# Patient Record
Sex: Female | Born: 1955
Health system: Southern US, Community
[De-identification: ages and names within clinical notes are randomized; demographics above are authoritative.]

## PROBLEM LIST (undated history)

## (undated) DIAGNOSIS — F32A Depression, unspecified: Secondary | ICD-10-CM

## (undated) DIAGNOSIS — Z87442 Personal history of urinary calculi: Secondary | ICD-10-CM

## (undated) DIAGNOSIS — F329 Major depressive disorder, single episode, unspecified: Secondary | ICD-10-CM

---
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
## 2014-06-13 ENCOUNTER — Other Ambulatory Visit (HOSPITAL_COMMUNITY)
Admission: RE | Admit: 2014-06-13 | Discharge: 2014-06-13 | Disposition: A | Discharge status: Home / Self Care | Payer: 59 | Source: Ambulatory Visit | Attending: Family Medicine | Admitting: Family Medicine

## 2014-06-13 DIAGNOSIS — Z124 Encounter for screening for malignant neoplasm of cervix: Secondary | ICD-10-CM | POA: Insufficient documentation

## 2014-06-18 ENCOUNTER — Other Ambulatory Visit: Payer: Self-pay | Admitting: Family Medicine

## 2014-06-18 DIAGNOSIS — D242 Benign neoplasm of left breast: Secondary | ICD-10-CM

## 2014-06-18 DIAGNOSIS — R928 Other abnormal and inconclusive findings on diagnostic imaging of breast: Secondary | ICD-10-CM

## 2014-07-02 ENCOUNTER — Ambulatory Visit
Admission: RE | Admit: 2014-07-02 | Discharge: 2014-07-02 | Disposition: A | Discharge status: Home / Self Care | Payer: 59 | Source: Ambulatory Visit | Attending: Family Medicine | Admitting: Family Medicine

## 2014-07-02 DIAGNOSIS — D242 Benign neoplasm of left breast: Secondary | ICD-10-CM

## 2014-07-02 DIAGNOSIS — R928 Other abnormal and inconclusive findings on diagnostic imaging of breast: Secondary | ICD-10-CM

## 2015-09-23 ENCOUNTER — Other Ambulatory Visit: Payer: Self-pay | Admitting: Family Medicine

## 2015-09-23 DIAGNOSIS — R928 Other abnormal and inconclusive findings on diagnostic imaging of breast: Secondary | ICD-10-CM

## 2015-09-27 ENCOUNTER — Ambulatory Visit
Admission: RE | Admit: 2015-09-27 | Discharge: 2015-09-27 | Disposition: A | Discharge status: Home / Self Care | Payer: 59 | Source: Ambulatory Visit | Attending: Family Medicine | Admitting: Family Medicine

## 2015-09-27 DIAGNOSIS — R928 Other abnormal and inconclusive findings on diagnostic imaging of breast: Secondary | ICD-10-CM

## 2016-10-15 ENCOUNTER — Other Ambulatory Visit: Payer: Self-pay | Admitting: Family Medicine

## 2016-10-15 ENCOUNTER — Other Ambulatory Visit (HOSPITAL_COMMUNITY)
Admission: RE | Admit: 2016-10-15 | Discharge: 2016-10-15 | Disposition: A | Discharge status: Home / Self Care | Payer: 59 | Source: Ambulatory Visit | Attending: Family Medicine | Admitting: Family Medicine

## 2016-10-15 DIAGNOSIS — Z124 Encounter for screening for malignant neoplasm of cervix: Secondary | ICD-10-CM | POA: Insufficient documentation

## 2016-10-19 ENCOUNTER — Other Ambulatory Visit: Payer: Self-pay | Admitting: Family Medicine

## 2016-10-19 DIAGNOSIS — Z1231 Encounter for screening mammogram for malignant neoplasm of breast: Secondary | ICD-10-CM

## 2016-10-19 LAB — CYTOLOGY - PAP: Diagnosis: NEGATIVE

## 2016-10-21 ENCOUNTER — Ambulatory Visit
Admission: RE | Admit: 2016-10-21 | Discharge: 2016-10-21 | Disposition: A | Discharge status: Home / Self Care | Payer: 59 | Source: Ambulatory Visit | Attending: Family Medicine | Admitting: Family Medicine

## 2016-10-21 DIAGNOSIS — Z1231 Encounter for screening mammogram for malignant neoplasm of breast: Secondary | ICD-10-CM

## 2017-08-13 DIAGNOSIS — R7303 Prediabetes: Secondary | ICD-10-CM | POA: Diagnosis not present

## 2017-10-14 DIAGNOSIS — R7303 Prediabetes: Secondary | ICD-10-CM | POA: Diagnosis not present

## 2017-10-14 DIAGNOSIS — E039 Hypothyroidism, unspecified: Secondary | ICD-10-CM | POA: Diagnosis not present

## 2017-10-14 DIAGNOSIS — E7800 Pure hypercholesterolemia, unspecified: Secondary | ICD-10-CM | POA: Diagnosis not present

## 2017-10-19 DIAGNOSIS — E039 Hypothyroidism, unspecified: Secondary | ICD-10-CM | POA: Diagnosis not present

## 2017-10-19 DIAGNOSIS — Z0000 Encounter for general adult medical examination without abnormal findings: Secondary | ICD-10-CM | POA: Diagnosis not present

## 2017-10-19 DIAGNOSIS — F339 Major depressive disorder, recurrent, unspecified: Secondary | ICD-10-CM | POA: Diagnosis not present

## 2017-10-19 DIAGNOSIS — R7303 Prediabetes: Secondary | ICD-10-CM | POA: Diagnosis not present

## 2017-10-19 DIAGNOSIS — E7800 Pure hypercholesterolemia, unspecified: Secondary | ICD-10-CM | POA: Diagnosis not present

## 2017-10-26 DIAGNOSIS — S60912D Unspecified superficial injury of left wrist, subsequent encounter: Secondary | ICD-10-CM | POA: Diagnosis not present

## 2017-10-26 DIAGNOSIS — E039 Hypothyroidism, unspecified: Secondary | ICD-10-CM | POA: Diagnosis not present

## 2017-10-26 DIAGNOSIS — S60212A Contusion of left wrist, initial encounter: Secondary | ICD-10-CM | POA: Diagnosis not present

## 2017-10-26 DIAGNOSIS — S63502A Unspecified sprain of left wrist, initial encounter: Secondary | ICD-10-CM | POA: Diagnosis not present

## 2017-10-26 DIAGNOSIS — S638X2A Sprain of other part of left wrist and hand, initial encounter: Secondary | ICD-10-CM | POA: Diagnosis not present

## 2017-10-27 ENCOUNTER — Other Ambulatory Visit: Payer: Self-pay | Admitting: Family Medicine

## 2017-10-27 DIAGNOSIS — Z1231 Encounter for screening mammogram for malignant neoplasm of breast: Secondary | ICD-10-CM

## 2017-10-29 DIAGNOSIS — S60212A Contusion of left wrist, initial encounter: Secondary | ICD-10-CM | POA: Diagnosis not present

## 2017-10-29 DIAGNOSIS — S638X2A Sprain of other part of left wrist and hand, initial encounter: Secondary | ICD-10-CM | POA: Diagnosis not present

## 2017-10-29 DIAGNOSIS — S60912D Unspecified superficial injury of left wrist, subsequent encounter: Secondary | ICD-10-CM | POA: Diagnosis not present

## 2017-10-29 DIAGNOSIS — S63502A Unspecified sprain of left wrist, initial encounter: Secondary | ICD-10-CM | POA: Diagnosis not present

## 2017-11-02 DIAGNOSIS — S60912D Unspecified superficial injury of left wrist, subsequent encounter: Secondary | ICD-10-CM | POA: Diagnosis not present

## 2017-11-02 DIAGNOSIS — S60212A Contusion of left wrist, initial encounter: Secondary | ICD-10-CM | POA: Diagnosis not present

## 2017-11-02 DIAGNOSIS — S63502A Unspecified sprain of left wrist, initial encounter: Secondary | ICD-10-CM | POA: Diagnosis not present

## 2017-11-02 DIAGNOSIS — S638X2A Sprain of other part of left wrist and hand, initial encounter: Secondary | ICD-10-CM | POA: Diagnosis not present

## 2017-11-04 DIAGNOSIS — F411 Generalized anxiety disorder: Secondary | ICD-10-CM | POA: Diagnosis not present

## 2017-11-04 DIAGNOSIS — F331 Major depressive disorder, recurrent, moderate: Secondary | ICD-10-CM | POA: Diagnosis not present

## 2017-11-04 DIAGNOSIS — S63502A Unspecified sprain of left wrist, initial encounter: Secondary | ICD-10-CM | POA: Diagnosis not present

## 2017-11-04 DIAGNOSIS — S60912D Unspecified superficial injury of left wrist, subsequent encounter: Secondary | ICD-10-CM | POA: Diagnosis not present

## 2017-11-04 DIAGNOSIS — S60212A Contusion of left wrist, initial encounter: Secondary | ICD-10-CM | POA: Diagnosis not present

## 2017-11-04 DIAGNOSIS — S638X2A Sprain of other part of left wrist and hand, initial encounter: Secondary | ICD-10-CM | POA: Diagnosis not present

## 2017-11-09 DIAGNOSIS — S60212A Contusion of left wrist, initial encounter: Secondary | ICD-10-CM | POA: Diagnosis not present

## 2017-11-09 DIAGNOSIS — S60912D Unspecified superficial injury of left wrist, subsequent encounter: Secondary | ICD-10-CM | POA: Diagnosis not present

## 2017-11-09 DIAGNOSIS — S638X2A Sprain of other part of left wrist and hand, initial encounter: Secondary | ICD-10-CM | POA: Diagnosis not present

## 2017-11-09 DIAGNOSIS — S63502A Unspecified sprain of left wrist, initial encounter: Secondary | ICD-10-CM | POA: Diagnosis not present

## 2017-11-11 DIAGNOSIS — S60912D Unspecified superficial injury of left wrist, subsequent encounter: Secondary | ICD-10-CM | POA: Diagnosis not present

## 2017-11-11 DIAGNOSIS — S60212A Contusion of left wrist, initial encounter: Secondary | ICD-10-CM | POA: Diagnosis not present

## 2017-11-11 DIAGNOSIS — S638X2A Sprain of other part of left wrist and hand, initial encounter: Secondary | ICD-10-CM | POA: Diagnosis not present

## 2017-11-11 DIAGNOSIS — S63502A Unspecified sprain of left wrist, initial encounter: Secondary | ICD-10-CM | POA: Diagnosis not present

## 2017-11-16 ENCOUNTER — Ambulatory Visit
Admission: RE | Admit: 2017-11-16 | Discharge: 2017-11-16 | Disposition: A | Discharge status: Home / Self Care | Payer: BLUE CROSS/BLUE SHIELD | Source: Ambulatory Visit | Attending: Family Medicine | Admitting: Family Medicine

## 2017-11-16 DIAGNOSIS — Z1231 Encounter for screening mammogram for malignant neoplasm of breast: Secondary | ICD-10-CM | POA: Diagnosis not present

## 2017-11-23 DIAGNOSIS — S638X2A Sprain of other part of left wrist and hand, initial encounter: Secondary | ICD-10-CM | POA: Diagnosis not present

## 2017-11-23 DIAGNOSIS — S63502A Unspecified sprain of left wrist, initial encounter: Secondary | ICD-10-CM | POA: Diagnosis not present

## 2017-11-23 DIAGNOSIS — S60212A Contusion of left wrist, initial encounter: Secondary | ICD-10-CM | POA: Diagnosis not present

## 2017-11-23 DIAGNOSIS — S60912D Unspecified superficial injury of left wrist, subsequent encounter: Secondary | ICD-10-CM | POA: Diagnosis not present

## 2017-11-25 DIAGNOSIS — S60912D Unspecified superficial injury of left wrist, subsequent encounter: Secondary | ICD-10-CM | POA: Diagnosis not present

## 2017-11-25 DIAGNOSIS — S60212A Contusion of left wrist, initial encounter: Secondary | ICD-10-CM | POA: Diagnosis not present

## 2017-11-25 DIAGNOSIS — S638X2A Sprain of other part of left wrist and hand, initial encounter: Secondary | ICD-10-CM | POA: Diagnosis not present

## 2017-11-25 DIAGNOSIS — S63502A Unspecified sprain of left wrist, initial encounter: Secondary | ICD-10-CM | POA: Diagnosis not present

## 2017-12-22 DIAGNOSIS — M8588 Other specified disorders of bone density and structure, other site: Secondary | ICD-10-CM | POA: Diagnosis not present

## 2018-01-27 DIAGNOSIS — L659 Nonscarring hair loss, unspecified: Secondary | ICD-10-CM | POA: Diagnosis not present

## 2018-01-27 DIAGNOSIS — E039 Hypothyroidism, unspecified: Secondary | ICD-10-CM | POA: Diagnosis not present

## 2018-03-17 DIAGNOSIS — D2271 Melanocytic nevi of right lower limb, including hip: Secondary | ICD-10-CM | POA: Diagnosis not present

## 2018-03-17 DIAGNOSIS — L814 Other melanin hyperpigmentation: Secondary | ICD-10-CM | POA: Diagnosis not present

## 2018-03-17 DIAGNOSIS — D225 Melanocytic nevi of trunk: Secondary | ICD-10-CM | POA: Diagnosis not present

## 2018-03-17 DIAGNOSIS — D2272 Melanocytic nevi of left lower limb, including hip: Secondary | ICD-10-CM | POA: Diagnosis not present

## 2018-09-06 DIAGNOSIS — F411 Generalized anxiety disorder: Secondary | ICD-10-CM | POA: Diagnosis not present

## 2018-09-06 DIAGNOSIS — F331 Major depressive disorder, recurrent, moderate: Secondary | ICD-10-CM | POA: Diagnosis not present

## 2018-09-19 DIAGNOSIS — R300 Dysuria: Secondary | ICD-10-CM | POA: Diagnosis not present

## 2018-11-21 ENCOUNTER — Other Ambulatory Visit: Payer: Self-pay | Admitting: Family Medicine

## 2018-11-21 DIAGNOSIS — Z1231 Encounter for screening mammogram for malignant neoplasm of breast: Secondary | ICD-10-CM

## 2018-11-22 DIAGNOSIS — R7303 Prediabetes: Secondary | ICD-10-CM | POA: Diagnosis not present

## 2018-11-22 DIAGNOSIS — E039 Hypothyroidism, unspecified: Secondary | ICD-10-CM | POA: Diagnosis not present

## 2018-11-22 DIAGNOSIS — E7800 Pure hypercholesterolemia, unspecified: Secondary | ICD-10-CM | POA: Diagnosis not present

## 2018-11-22 DIAGNOSIS — F339 Major depressive disorder, recurrent, unspecified: Secondary | ICD-10-CM | POA: Diagnosis not present

## 2018-12-02 HISTORY — PX: CHOLECYSTECTOMY: SHX55

## 2018-12-05 DIAGNOSIS — N390 Urinary tract infection, site not specified: Secondary | ICD-10-CM | POA: Diagnosis not present

## 2018-12-05 DIAGNOSIS — R300 Dysuria: Secondary | ICD-10-CM | POA: Diagnosis not present

## 2018-12-15 DIAGNOSIS — R7303 Prediabetes: Secondary | ICD-10-CM | POA: Diagnosis not present

## 2018-12-15 DIAGNOSIS — Z823 Family history of stroke: Secondary | ICD-10-CM | POA: Diagnosis not present

## 2018-12-15 DIAGNOSIS — E039 Hypothyroidism, unspecified: Secondary | ICD-10-CM | POA: Diagnosis not present

## 2018-12-15 DIAGNOSIS — E7800 Pure hypercholesterolemia, unspecified: Secondary | ICD-10-CM | POA: Diagnosis not present

## 2018-12-24 DIAGNOSIS — M8580 Other specified disorders of bone density and structure, unspecified site: Secondary | ICD-10-CM | POA: Diagnosis not present

## 2018-12-24 DIAGNOSIS — E039 Hypothyroidism, unspecified: Secondary | ICD-10-CM | POA: Diagnosis not present

## 2018-12-24 DIAGNOSIS — K828 Other specified diseases of gallbladder: Secondary | ICD-10-CM | POA: Diagnosis not present

## 2018-12-24 DIAGNOSIS — N2889 Other specified disorders of kidney and ureter: Secondary | ICD-10-CM | POA: Diagnosis not present

## 2018-12-24 DIAGNOSIS — F329 Major depressive disorder, single episode, unspecified: Secondary | ICD-10-CM | POA: Diagnosis not present

## 2018-12-24 DIAGNOSIS — N200 Calculus of kidney: Secondary | ICD-10-CM | POA: Diagnosis not present

## 2018-12-24 DIAGNOSIS — K8020 Calculus of gallbladder without cholecystitis without obstruction: Secondary | ICD-10-CM | POA: Diagnosis not present

## 2018-12-24 DIAGNOSIS — N390 Urinary tract infection, site not specified: Secondary | ICD-10-CM | POA: Diagnosis not present

## 2018-12-24 DIAGNOSIS — K8000 Calculus of gallbladder with acute cholecystitis without obstruction: Secondary | ICD-10-CM | POA: Diagnosis not present

## 2018-12-24 DIAGNOSIS — N119 Chronic tubulo-interstitial nephritis, unspecified: Secondary | ICD-10-CM | POA: Diagnosis not present

## 2018-12-24 DIAGNOSIS — N3090 Cystitis, unspecified without hematuria: Secondary | ICD-10-CM | POA: Diagnosis not present

## 2018-12-24 DIAGNOSIS — K810 Acute cholecystitis: Secondary | ICD-10-CM | POA: Diagnosis not present

## 2018-12-24 DIAGNOSIS — N12 Tubulo-interstitial nephritis, not specified as acute or chronic: Secondary | ICD-10-CM | POA: Diagnosis not present

## 2018-12-24 DIAGNOSIS — Z7983 Long term (current) use of bisphosphonates: Secondary | ICD-10-CM | POA: Diagnosis not present

## 2018-12-24 DIAGNOSIS — R399 Unspecified symptoms and signs involving the genitourinary system: Secondary | ICD-10-CM | POA: Diagnosis not present

## 2018-12-24 DIAGNOSIS — A419 Sepsis, unspecified organism: Secondary | ICD-10-CM | POA: Diagnosis not present

## 2018-12-24 DIAGNOSIS — B961 Klebsiella pneumoniae [K. pneumoniae] as the cause of diseases classified elsewhere: Secondary | ICD-10-CM | POA: Diagnosis not present

## 2018-12-24 DIAGNOSIS — Z1159 Encounter for screening for other viral diseases: Secondary | ICD-10-CM | POA: Diagnosis not present

## 2018-12-24 DIAGNOSIS — R109 Unspecified abdominal pain: Secondary | ICD-10-CM | POA: Diagnosis not present

## 2018-12-25 DIAGNOSIS — R109 Unspecified abdominal pain: Secondary | ICD-10-CM | POA: Diagnosis not present

## 2018-12-25 DIAGNOSIS — K8000 Calculus of gallbladder with acute cholecystitis without obstruction: Secondary | ICD-10-CM | POA: Diagnosis not present

## 2018-12-25 DIAGNOSIS — N390 Urinary tract infection, site not specified: Secondary | ICD-10-CM | POA: Diagnosis not present

## 2018-12-25 DIAGNOSIS — R399 Unspecified symptoms and signs involving the genitourinary system: Secondary | ICD-10-CM | POA: Diagnosis not present

## 2018-12-25 DIAGNOSIS — K810 Acute cholecystitis: Secondary | ICD-10-CM | POA: Diagnosis not present

## 2018-12-25 DIAGNOSIS — N200 Calculus of kidney: Secondary | ICD-10-CM | POA: Diagnosis not present

## 2018-12-26 DIAGNOSIS — N200 Calculus of kidney: Secondary | ICD-10-CM | POA: Diagnosis not present

## 2018-12-26 DIAGNOSIS — N390 Urinary tract infection, site not specified: Secondary | ICD-10-CM | POA: Diagnosis not present

## 2018-12-26 DIAGNOSIS — K810 Acute cholecystitis: Secondary | ICD-10-CM | POA: Diagnosis not present

## 2019-01-16 DIAGNOSIS — N200 Calculus of kidney: Secondary | ICD-10-CM | POA: Diagnosis not present

## 2019-01-16 DIAGNOSIS — N3000 Acute cystitis without hematuria: Secondary | ICD-10-CM | POA: Diagnosis not present

## 2019-02-15 DIAGNOSIS — N200 Calculus of kidney: Secondary | ICD-10-CM | POA: Diagnosis not present

## 2019-02-20 DIAGNOSIS — E039 Hypothyroidism, unspecified: Secondary | ICD-10-CM | POA: Diagnosis not present

## 2019-02-21 ENCOUNTER — Other Ambulatory Visit (HOSPITAL_COMMUNITY): Payer: Self-pay | Admitting: Urology

## 2019-02-21 ENCOUNTER — Other Ambulatory Visit: Payer: Self-pay | Admitting: Urology

## 2019-02-21 DIAGNOSIS — N200 Calculus of kidney: Secondary | ICD-10-CM

## 2019-02-23 ENCOUNTER — Other Ambulatory Visit: Payer: Self-pay

## 2019-02-23 ENCOUNTER — Ambulatory Visit
Admission: RE | Admit: 2019-02-23 | Discharge: 2019-02-23 | Disposition: A | Discharge status: Home / Self Care | Payer: BC Managed Care – PPO | Source: Ambulatory Visit | Attending: Family Medicine | Admitting: Family Medicine

## 2019-02-23 DIAGNOSIS — Z1231 Encounter for screening mammogram for malignant neoplasm of breast: Secondary | ICD-10-CM | POA: Diagnosis not present

## 2019-03-14 DIAGNOSIS — Z0000 Encounter for general adult medical examination without abnormal findings: Secondary | ICD-10-CM | POA: Diagnosis not present

## 2019-03-14 DIAGNOSIS — M8580 Other specified disorders of bone density and structure, unspecified site: Secondary | ICD-10-CM | POA: Diagnosis not present

## 2019-03-14 DIAGNOSIS — E7800 Pure hypercholesterolemia, unspecified: Secondary | ICD-10-CM | POA: Diagnosis not present

## 2019-03-14 DIAGNOSIS — E039 Hypothyroidism, unspecified: Secondary | ICD-10-CM | POA: Diagnosis not present

## 2019-03-14 DIAGNOSIS — F339 Major depressive disorder, recurrent, unspecified: Secondary | ICD-10-CM | POA: Diagnosis not present

## 2019-03-28 DIAGNOSIS — N200 Calculus of kidney: Secondary | ICD-10-CM | POA: Diagnosis not present

## 2019-03-28 DIAGNOSIS — R8271 Bacteriuria: Secondary | ICD-10-CM | POA: Diagnosis not present

## 2019-03-30 NOTE — Patient Instructions (Addendum)
DUE TO COVID-19 ONLY ONE VISITOR IS ALLOWED TO COME WITH YOU AND STAY IN THE WAITING ROOM ONLY DURING PRE OP AND PROCEDURE DAY OF SURGERY. THE 1 VISITOR MAY VISIT WITH YOU AFTER SURGERY IN YOUR PRIVATE ROOM DURING VISITING HOURS ONLY!  YOU NEED TO HAVE A COVID 19 TEST ON Monday 03-31-2019 @, THIS TEST MUST BE DONE BEFORE SURGERY, COME  [address], [place] , [address].  ([place]) ONCE YOUR COVID TEST IS COMPLETED, PLEASE BEGIN THE QUARANTINE INSTRUCTIONS AS OUTLINED IN YOUR HANDOUT.                Kamorra Allery    Your procedure is scheduled on: 04-06-2019   Report to [facility] Main  Entrance   Report to [facility] at 800  AM     Call this number if you have problems the morning of surgery [phone]    Remember: Do not eat food or drink liquids :After Midnight. BRUSH YOUR TEETH MORNING OF SURGERY AND RINSE YOUR MOUTH OUT, NO CHEWING GUM CANDY OR MINTS.    Take these medicines the morning of surgery with A SIP OF WATER: bupropion, levothyroxine                                 You may not have any metal on your body including hair pins and              piercings  Do not wear jewelry, make-up, lotions, powders or perfumes, deodorant             Do not wear nail polish.  Do not shave  48 hours prior to surgery.               Do not bring valuables to the hospital. [place].  Contacts, dentures or bridgework may not be worn into surgery.  Leave suitcase in the car. After surgery it may be brought to your room.     _____________________________________________________________________             [facility] - Preparing for Surgery Before surgery, you can play an important role.  Because skin is not sterile, your skin needs to be as free of germs as possible.  You can reduce the number of germs on your skin by washing with CHG (chlorahexidine gluconate) soap before surgery.  CHG is an  antiseptic cleaner which kills germs and bonds with the skin to continue killing germs even after washing. Please DO NOT use if you have an allergy to CHG or antibacterial soaps.  If your skin becomes reddened/irritated stop using the CHG and inform your nurse when you arrive at Short Stay. Do not shave (including legs and underarms) for at least 48 hours prior to the first CHG shower.  You may shave your face/neck. Please follow these instructions carefully:  1.  Shower with CHG Soap the night before surgery and the  morning of Surgery.  2.  If you choose to wash your hair, wash your hair first as usual with your  normal  shampoo.  3.  After you shampoo, rinse your hair and body thoroughly to remove the  shampoo.  4.  Use CHG as you would any other liquid soap.  You can apply chg directly  to the skin and wash                       Gently with a scrungie or clean washcloth.  5.  Apply the CHG Soap to your body ONLY FROM THE NECK DOWN.   Do not use on face/ open                           Wound or open sores. Avoid contact with eyes, ears mouth and genitals (private parts).                       Wash face,  Genitals (private parts) with your normal soap.             6.  Wash thoroughly, paying special attention to the area where your surgery  will be performed.  7.  Thoroughly rinse your body with warm water from the neck down.  8.  DO NOT shower/wash with your normal soap after using and rinsing off  the CHG Soap.                9.  Pat yourself dry with a clean towel.            10.  Wear clean pajamas.            11.  Place clean sheets on your bed the night of your first shower and do not  sleep with pets. Day of Surgery : Do not apply any lotions/deodorants the morning of surgery.  Please wear clean clothes to the hospital/surgery center.  FAILURE TO FOLLOW THESE INSTRUCTIONS MAY RESULT IN THE CANCELLATION OF YOUR SURGERY PATIENT  SIGNATURE_________________________________  NURSE SIGNATURE__________________________________  ________________________________________________________________________  WHAT IS A BLOOD TRANSFUSION? Blood Transfusion Information  A transfusion is the replacement of blood or some of its parts. Blood is made up of multiple cells which provide different functions.  Red blood cells carry oxygen and are used for blood loss replacement.  White blood cells fight against infection.  Platelets control bleeding.  Plasma helps clot blood.  Other blood products are available for specialized needs, such as hemophilia or other clotting disorders. BEFORE THE TRANSFUSION  Who gives blood for transfusions?   Healthy volunteers who are fully evaluated to make sure their blood is safe. This is blood bank blood. Transfusion therapy is the safest it has ever been in the practice of medicine. Before blood is taken from a donor, a complete history is taken to make sure that person has no history of diseases nor engages in risky social behavior (examples are intravenous drug use or sexual activity with multiple partners). The donor's travel history is screened to minimize risk of transmitting infections, such as malaria. The donated blood is tested for signs of infectious diseases, such as HIV and hepatitis. The blood is then tested to be sure it is compatible with you in order to minimize the chance of a transfusion reaction. If you or a relative donates blood, this is often done in anticipation of surgery and is not appropriate for emergency situations. It takes many days to process the donated blood. RISKS AND COMPLICATIONS Although transfusion therapy is very safe and saves many lives, the main dangers of transfusion include:   Getting an infectious disease.  Developing a transfusion reaction. This  is an allergic reaction to something in the blood you were given. Every precaution is taken to prevent  this. The decision to have a blood transfusion has been considered carefully by your caregiver before blood is given. Blood is not given unless the benefits outweigh the risks. AFTER THE TRANSFUSION  Right after receiving a blood transfusion, you will usually feel much better and more energetic. This is especially true if your red blood cells have gotten low (anemic). The transfusion raises the level of the red blood cells which carry oxygen, and this usually causes an energy increase.  The nurse administering the transfusion will monitor you carefully for complications. HOME CARE INSTRUCTIONS  No special instructions are needed after a transfusion. You may find your energy is better. Speak with your caregiver about any limitations on activity for underlying diseases you may have. SEEK MEDICAL CARE IF:   Your condition is not improving after your transfusion.  You develop redness or irritation at the intravenous (IV) site. SEEK IMMEDIATE MEDICAL CARE IF:  Any of the following symptoms occur over the next 12 hours:  Shaking chills.  You have a temperature by mouth above 102 F (38.9 C), not controlled by medicine.  Chest, back, or muscle pain.  People around you feel you are not acting correctly or are confused.  Shortness of breath or difficulty breathing.  Dizziness and fainting.  You get a rash or develop hives.  You have a decrease in urine output.  Your urine turns a dark color or changes to pink, red, or brown. Any of the following symptoms occur over the next 10 days:  You have a temperature by mouth above 102 F (38.9 C), not controlled by medicine.  Shortness of breath.  Weakness after normal activity.  The white part of the eye turns yellow (jaundice).  You have a decrease in the amount of urine or are urinating less often.  Your urine turns a dark color or changes to pink, red, or brown. Document Released: 07/17/2000 Document Revised: 10/12/2011 Document  Reviewed: 03/05/2008 [facility] Patient Information 2014 [place], [place].  _______________________________________________________________________

## 2019-04-03 ENCOUNTER — Encounter (HOSPITAL_COMMUNITY): Payer: Self-pay

## 2019-04-03 ENCOUNTER — Other Ambulatory Visit (HOSPITAL_COMMUNITY)
Admission: RE | Admit: 2019-04-03 | Discharge: 2019-04-03 | Disposition: A | Discharge status: Home / Self Care | Payer: BC Managed Care – PPO | Source: Ambulatory Visit | Attending: Urology | Admitting: Urology

## 2019-04-03 ENCOUNTER — Encounter (HOSPITAL_COMMUNITY)
Admission: RE | Admit: 2019-04-03 | Discharge: 2019-04-03 | Disposition: A | Discharge status: Home / Self Care | Payer: BC Managed Care – PPO | Source: Ambulatory Visit | Attending: Urology | Admitting: Urology

## 2019-04-03 ENCOUNTER — Other Ambulatory Visit: Payer: Self-pay

## 2019-04-03 ENCOUNTER — Other Ambulatory Visit: Payer: Self-pay | Admitting: Urology

## 2019-04-03 ENCOUNTER — Encounter (INDEPENDENT_AMBULATORY_CARE_PROVIDER_SITE_OTHER): Payer: Self-pay

## 2019-04-03 DIAGNOSIS — Z01812 Encounter for preprocedural laboratory examination: Secondary | ICD-10-CM | POA: Diagnosis not present

## 2019-04-03 DIAGNOSIS — Z20828 Contact with and (suspected) exposure to other viral communicable diseases: Secondary | ICD-10-CM | POA: Insufficient documentation

## 2019-04-03 HISTORY — DX: Depression, unspecified: F32.A

## 2019-04-03 HISTORY — DX: Personal history of urinary calculi: Z87.442

## 2019-04-03 LAB — BASIC METABOLIC PANEL
Anion gap: 8 (ref 5–15)
BUN: 12 mg/dL (ref 8–23)
CO2: 26 mmol/L (ref 22–32)
Calcium: 9.4 mg/dL (ref 8.9–10.3)
Chloride: 106 mmol/L (ref 98–111)
Creatinine, Ser: 0.68 mg/dL (ref 0.44–1.00)
GFR calc Af Amer: >60 mL/min (ref >60)
GFR calc non Af Amer: >60 mL/min (ref >60)
Glucose, Bld: 104 mg/dL — ABNORMAL HIGH (ref 70–99)
Potassium: 4.4 mmol/L (ref 3.5–5.1)
Sodium: 140 mmol/L (ref 135–145)

## 2019-04-03 LAB — ABO/RH: ABO/RH(D): O POS

## 2019-04-03 LAB — SARS CORONAVIRUS 2 (TAT 6-24 HRS): SARS Coronavirus 2: NEGATIVE

## 2019-04-03 LAB — CBC
HCT: 43.7 % (ref 36.0–46.0)
Hemoglobin: 14 g/dL (ref 12.0–15.0)
MCH: 30 pg (ref 26.0–34.0)
MCHC: 32 g/dL (ref 30.0–36.0)
MCV: 93.8 fL (ref 80.0–100.0)
Platelets: 295 10*3/uL (ref 150–400)
RBC: 4.66 MIL/uL (ref 3.87–5.11)
RDW: 13.4 % (ref 11.5–15.5)
WBC: 6.8 10*3/uL (ref 4.0–10.5)
nRBC: 0 % (ref 0.0–0.2)

## 2019-04-03 NOTE — Progress Notes (Signed)
Lm with selita needs new cosent order . Consent says dr Tresa Moore, surgeon is dr Diona Fanti.

## 2019-04-03 NOTE — Progress Notes (Addendum)
PCP -  Dr Leighton Ruff Cardiologist - none  Chest x-ray - none EKG - none Stress Test - none ECHO - none Cardiac Cath - none  Sleep Study - none CPAP - none  Fasting Blood Sugar - n/a Checks Blood Sugar _____ times a day  Blood Thinner Instructions:none Aspirin Instructions: Last Dose:  Anesthesia review:   Patient denies shortness of breath, fever, cough and chest pain at PAT appointment   Patient verbalized understanding of instructions that were given to them at the PAT appointment. Patient was also instructed that they will need to review over the PAT instructions again at home before surgery.

## 2019-04-05 ENCOUNTER — Other Ambulatory Visit: Payer: Self-pay | Admitting: Physician Assistant

## 2019-04-05 ENCOUNTER — Other Ambulatory Visit: Payer: Self-pay | Admitting: Radiology

## 2019-04-05 NOTE — H&P (Signed)
H&P  Chief Complaint: Rt sided dkidney stone  History of Present Illness: Tiffany Valentine is a 63 y.o. year old female who presents for percutaneous management of a large right renal stone. Past Medical History:  Diagnosis Date  . Depression   . History of kidney stones    right renal calculus    Past Surgical History:  Procedure Laterality Date  . CHOLECYSTECTOMY  12/2018    Home Medications:  No medications prior to admission.    Allergies: No Known Allergies  No family history on file.  Social History:  reports that she has never smoked. She has never used smokeless tobacco. She reports current alcohol use. She reports that she does not use drugs.  ROS: A complete review of systems was performed.  All systems are negative except for pertinent findings as noted.  Physical Exam:  Vital signs in last 24 hours:   General:  Alert and oriented, No acute distress HEENT: Normocephalic, atraumatic Neck: No JVD or lymphadenopathy Cardiovascular: Regular rate and rhythm Lungs: Clear bilaterally Abdomen: Soft, nontender, nondistended, no abdominal masses Back: No CVA tenderness Extremities: No edema Neurologic: Grossly intact  Laboratory Data:  No results found for this or any previous visit (from the past 24 hour(s)). Recent Results (from the past 240 hour(s))  SARS CORONAVIRUS 2 (TAT 6-24 HRS) Nasopharyngeal Nasopharyngeal Swab     Status: None   Collection Time: 04/03/19  9:13 AM   Specimen: Nasopharyngeal Swab  Result Value Ref Range Status   SARS Coronavirus 2 NEGATIVE NEGATIVE Final    Comment: (NOTE) SARS-CoV-2 target nucleic acids are NOT DETECTED. The SARS-CoV-2 RNA is generally detectable in upper and lower respiratory specimens during the acute phase of infection. Negative results do not preclude SARS-CoV-2 infection, do not rule out co-infections with other pathogens, and should not be used as the sole basis for treatment or other patient management  decisions. Negative results must be combined with clinical observations, patient history, and epidemiological information. The expected result is Negative. Fact Sheet for Patients: SugarRoll.be Fact Sheet for Healthcare Providers: https://www.woods-mathews.com/ This test is not yet approved or cleared by the [place] FDA and  has been authorized for detection and/or diagnosis of SARS-CoV-2 by FDA under an Emergency Use Authorization (EUA). This EUA will remain  in effect (meaning this test can be used) for the duration of the COVID-19 declaration under Section 56 4(b)(1) of the Act, 21 U.S.C. section 360bbb-3(b)(1), unless the authorization is terminated or revoked sooner. Performed at [facility], [address], [place], [place] [address]    Creatinine: Recent Labs    04/03/19 0841  CREATININE 0.68    Radiologic Imaging: No results found.  Impression/Assessment:    Large right renal calculus  Plan:   right percutaneous nephrolithotomy  Lillette Boxer Sadiq Mccauley 04/05/2019, 8:16 PM  Lillette Boxer. Tyria Springer MD

## 2019-04-06 ENCOUNTER — Ambulatory Visit (HOSPITAL_COMMUNITY): Payer: BC Managed Care – PPO | Admitting: Physician Assistant

## 2019-04-06 ENCOUNTER — Ambulatory Visit (HOSPITAL_COMMUNITY)
Admission: RE | Admit: 2019-04-06 | Discharge: 2019-04-06 | Disposition: A | Discharge status: Home / Self Care | Payer: BC Managed Care – PPO | Source: Ambulatory Visit | Attending: Urology | Admitting: Urology

## 2019-04-06 ENCOUNTER — Observation Stay (HOSPITAL_COMMUNITY)
Admission: RE | Admit: 2019-04-06 | Discharge: 2019-04-07 | Disposition: A | Discharge status: Home / Self Care | Payer: BC Managed Care – PPO | Source: Ambulatory Visit | Attending: Urology | Admitting: Urology

## 2019-04-06 ENCOUNTER — Encounter (HOSPITAL_COMMUNITY): Payer: Self-pay

## 2019-04-06 ENCOUNTER — Ambulatory Visit (HOSPITAL_COMMUNITY): Payer: BC Managed Care – PPO | Admitting: Certified Registered Nurse Anesthetist

## 2019-04-06 ENCOUNTER — Observation Stay (HOSPITAL_COMMUNITY): Payer: BC Managed Care – PPO

## 2019-04-06 ENCOUNTER — Other Ambulatory Visit: Payer: Self-pay

## 2019-04-06 ENCOUNTER — Ambulatory Visit (HOSPITAL_COMMUNITY): Payer: BC Managed Care – PPO

## 2019-04-06 ENCOUNTER — Encounter (HOSPITAL_COMMUNITY)
Admission: RE | Disposition: A | Discharge status: Home / Self Care | Payer: Self-pay | Source: Ambulatory Visit | Attending: Urology

## 2019-04-06 DIAGNOSIS — N200 Calculus of kidney: Secondary | ICD-10-CM | POA: Diagnosis not present

## 2019-04-06 DIAGNOSIS — Z20828 Contact with and (suspected) exposure to other viral communicable diseases: Secondary | ICD-10-CM | POA: Insufficient documentation

## 2019-04-06 DIAGNOSIS — Z79899 Other long term (current) drug therapy: Secondary | ICD-10-CM | POA: Diagnosis not present

## 2019-04-06 DIAGNOSIS — F329 Major depressive disorder, single episode, unspecified: Secondary | ICD-10-CM | POA: Insufficient documentation

## 2019-04-06 HISTORY — PX: IR URETERAL STENT RIGHT NEW ACCESS W/O SEP NEPHROSTOMY CATH: IMG6076

## 2019-04-06 HISTORY — PX: NEPHROLITHOTOMY: SHX5134

## 2019-04-06 LAB — TYPE AND SCREEN
ABO/RH(D): O POS
Antibody Screen: NEGATIVE

## 2019-04-06 LAB — PROTIME-INR
INR: 1.1 (ref 0.8–1.2)
Prothrombin Time: 13.6 seconds (ref 11.4–15.2)

## 2019-04-06 LAB — APTT: aPTT: 34 seconds (ref 24–36)

## 2019-04-06 SURGERY — NEPHROLITHOTOMY PERCUTANEOUS
Anesthesia: General | Laterality: Right

## 2019-04-06 MED ORDER — IOHEXOL 300 MG/ML  SOLN
50.0000 mL | Freq: Once | INTRAMUSCULAR | Status: AC | PRN
  Administered 2019-04-06: 20 mL

## 2019-04-06 MED ORDER — FENTANYL CITRATE (PF) 100 MCG/2ML IJ SOLN
INTRAMUSCULAR | Status: AC | PRN
  Administered 2019-04-06 (×2): 50 ug via INTRAVENOUS

## 2019-04-06 MED ORDER — PHENYLEPHRINE 40 MCG/ML (10ML) SYRINGE FOR IV PUSH (FOR BLOOD PRESSURE SUPPORT)
PREFILLED_SYRINGE | INTRAVENOUS | Status: AC
  Filled 2019-04-06: qty 10

## 2019-04-06 MED ORDER — LIDOCAINE HCL (PF) 1 % IJ SOLN
INTRAMUSCULAR | Status: AC | PRN
  Administered 2019-04-06: 5 mL

## 2019-04-06 MED ORDER — FENTANYL CITRATE (PF) 100 MCG/2ML IJ SOLN
INTRAMUSCULAR | Status: AC
  Filled 2019-04-06: qty 2

## 2019-04-06 MED ORDER — SODIUM CHLORIDE 0.9 % IR SOLN
Status: DC | PRN
  Administered 2019-04-06: 6000 mL

## 2019-04-06 MED ORDER — OXYCODONE HCL 5 MG PO TABS
5.0000 mg | ORAL_TABLET | ORAL | Status: DC | PRN
  Administered 2019-04-06 – 2019-04-07 (×2): 5 mg via ORAL
  Filled 2019-04-06 (×2): qty 1

## 2019-04-06 MED ORDER — ONDANSETRON HCL 4 MG/2ML IJ SOLN
INTRAMUSCULAR | Status: AC
  Filled 2019-04-06: qty 2

## 2019-04-06 MED ORDER — MEPERIDINE HCL 50 MG/ML IJ SOLN: 6.2500 mg | INTRAMUSCULAR | Status: DC | PRN

## 2019-04-06 MED ORDER — ONDANSETRON HCL 4 MG/2ML IJ SOLN
INTRAMUSCULAR | Status: DC | PRN
  Administered 2019-04-06: 4 mg via INTRAVENOUS

## 2019-04-06 MED ORDER — MIDAZOLAM HCL 2 MG/2ML IJ SOLN
INTRAMUSCULAR | Status: AC
  Filled 2019-04-06: qty 4

## 2019-04-06 MED ORDER — HYDROMORPHONE HCL 1 MG/ML IJ SOLN: 0.5000 mg | INTRAMUSCULAR | Status: DC | PRN

## 2019-04-06 MED ORDER — LIDOCAINE 2% (20 MG/ML) 5 ML SYRINGE
INTRAMUSCULAR | Status: DC | PRN
  Administered 2019-04-06: 50 mg via INTRAVENOUS

## 2019-04-06 MED ORDER — SUGAMMADEX SODIUM 200 MG/2ML IV SOLN
INTRAVENOUS | Status: DC | PRN
  Administered 2019-04-06: 200 mg via INTRAVENOUS

## 2019-04-06 MED ORDER — CIPROFLOXACIN IN D5W 400 MG/200ML IV SOLN
INTRAVENOUS | Status: AC
  Filled 2019-04-06: qty 200

## 2019-04-06 MED ORDER — FENTANYL CITRATE (PF) 100 MCG/2ML IJ SOLN
INTRAMUSCULAR | Status: DC | PRN
  Administered 2019-04-06: 50 ug via INTRAVENOUS

## 2019-04-06 MED ORDER — MIDAZOLAM HCL 5 MG/5ML IJ SOLN
INTRAMUSCULAR | Status: DC | PRN
  Administered 2019-04-06: 2 mg via INTRAVENOUS

## 2019-04-06 MED ORDER — ROCURONIUM BROMIDE 10 MG/ML (PF) SYRINGE
PREFILLED_SYRINGE | INTRAVENOUS | Status: AC
  Filled 2019-04-06: qty 10

## 2019-04-06 MED ORDER — PHENYLEPHRINE 40 MCG/ML (10ML) SYRINGE FOR IV PUSH (FOR BLOOD PRESSURE SUPPORT)
PREFILLED_SYRINGE | INTRAVENOUS | Status: DC | PRN
  Administered 2019-04-06: 80 ug via INTRAVENOUS

## 2019-04-06 MED ORDER — STERILE WATER FOR IRRIGATION IR SOLN
Status: DC | PRN
  Administered 2019-04-06: 500 mL

## 2019-04-06 MED ORDER — PROPOFOL 10 MG/ML IV BOLUS
INTRAVENOUS | Status: AC
  Filled 2019-04-06: qty 20

## 2019-04-06 MED ORDER — LEVOTHYROXINE SODIUM 75 MCG PO TABS
75.0000 ug | ORAL_TABLET | Freq: Every day | ORAL | Status: DC
  Administered 2019-04-07: 75 ug via ORAL
  Filled 2019-04-06: qty 1

## 2019-04-06 MED ORDER — CEFAZOLIN SODIUM-DEXTROSE 2-4 GM/100ML-% IV SOLN: 2.0000 g | INTRAVENOUS | Status: DC

## 2019-04-06 MED ORDER — MIDAZOLAM HCL 2 MG/2ML IJ SOLN
INTRAMUSCULAR | Status: AC | PRN
  Administered 2019-04-06 (×4): 0.5 mg via INTRAVENOUS
  Administered 2019-04-06: 1 mg via INTRAVENOUS

## 2019-04-06 MED ORDER — BUPROPION HCL ER (XL) 150 MG PO TB24
150.0000 mg | ORAL_TABLET | Freq: Every day | ORAL | Status: DC
  Administered 2019-04-07: 150 mg via ORAL
  Filled 2019-04-06: qty 1

## 2019-04-06 MED ORDER — MIDAZOLAM HCL 2 MG/2ML IJ SOLN
INTRAMUSCULAR | Status: AC
  Filled 2019-04-06: qty 2

## 2019-04-06 MED ORDER — DEXAMETHASONE SODIUM PHOSPHATE 10 MG/ML IJ SOLN
INTRAMUSCULAR | Status: AC
  Filled 2019-04-06: qty 1

## 2019-04-06 MED ORDER — LACTATED RINGERS IV SOLN
INTRAVENOUS | Status: DC
  Administered 2019-04-06 (×2): via INTRAVENOUS

## 2019-04-06 MED ORDER — CEPHALEXIN 500 MG PO CAPS
500.0000 mg | ORAL_CAPSULE | Freq: Two times a day (BID) | ORAL | Status: DC
  Administered 2019-04-06 – 2019-04-07 (×2): 500 mg via ORAL
  Filled 2019-04-06 (×2): qty 1

## 2019-04-06 MED ORDER — SODIUM CHLORIDE 0.9 % IV SOLN: INTRAVENOUS | Status: DC

## 2019-04-06 MED ORDER — LIDOCAINE 2% (20 MG/ML) 5 ML SYRINGE
INTRAMUSCULAR | Status: AC
  Filled 2019-04-06: qty 5

## 2019-04-06 MED ORDER — ONDANSETRON HCL 4 MG/2ML IJ SOLN: 4.0000 mg | INTRAMUSCULAR | Status: DC | PRN

## 2019-04-06 MED ORDER — DEXAMETHASONE SODIUM PHOSPHATE 10 MG/ML IJ SOLN
INTRAMUSCULAR | Status: DC | PRN
  Administered 2019-04-06: 10 mg via INTRAVENOUS

## 2019-04-06 MED ORDER — PROPOFOL 10 MG/ML IV BOLUS
INTRAVENOUS | Status: DC | PRN
  Administered 2019-04-06: 100 mg via INTRAVENOUS

## 2019-04-06 MED ORDER — CIPROFLOXACIN IN D5W 400 MG/200ML IV SOLN
400.0000 mg | Freq: Once | INTRAVENOUS | Status: AC
  Administered 2019-04-06: 400 mg via INTRAVENOUS

## 2019-04-06 MED ORDER — SENNA 8.6 MG PO TABS
1.0000 | ORAL_TABLET | Freq: Two times a day (BID) | ORAL | Status: DC
  Administered 2019-04-06: 8.6 mg via ORAL
  Filled 2019-04-06 (×2): qty 1

## 2019-04-06 MED ORDER — HYDROMORPHONE HCL 1 MG/ML IJ SOLN: 0.2500 mg | INTRAMUSCULAR | Status: DC | PRN

## 2019-04-06 MED ORDER — SODIUM CHLORIDE 0.45 % IV SOLN
INTRAVENOUS | Status: DC
  Administered 2019-04-06 – 2019-04-07 (×2): via INTRAVENOUS

## 2019-04-06 MED ORDER — LIDOCAINE HCL 1 % IJ SOLN
INTRAMUSCULAR | Status: AC
  Filled 2019-04-06: qty 20

## 2019-04-06 MED ORDER — ACETAMINOPHEN 325 MG PO TABS
650.0000 mg | ORAL_TABLET | ORAL | Status: DC | PRN
  Administered 2019-04-06 – 2019-04-07 (×2): 650 mg via ORAL
  Filled 2019-04-06 (×2): qty 2

## 2019-04-06 MED ORDER — ROCURONIUM BROMIDE 50 MG/5ML IV SOSY
PREFILLED_SYRINGE | INTRAVENOUS | Status: DC | PRN
  Administered 2019-04-06: 50 mg via INTRAVENOUS
  Administered 2019-04-06 (×2): 10 mg via INTRAVENOUS

## 2019-04-06 MED ORDER — ALENDRONATE SODIUM 70 MG PO TABS: 70.0000 mg | ORAL_TABLET | ORAL | Status: DC

## 2019-04-06 MED ORDER — ONDANSETRON HCL 4 MG/2ML IJ SOLN: 4.0000 mg | Freq: Once | INTRAMUSCULAR | Status: DC | PRN

## 2019-04-06 SURGICAL SUPPLY — 53 items
BAG URINE DRAINAGE (UROLOGICAL SUPPLIES) IMPLANT
BASKET ZERO TIP NITINOL 2.4FR (BASKET) ×2 IMPLANT
BENZOIN TINCTURE PRP APPL 2/3 (GAUZE/BANDAGES/DRESSINGS) ×4 IMPLANT
BLADE SURG 15 STRL LF DISP TIS (BLADE) ×1 IMPLANT
BLADE SURG 15 STRL SS (BLADE) ×1
CATH FOLEY 2W COUNCIL 20FR 5CC (CATHETERS) IMPLANT
CATH ROBINSON RED A/P 20FR (CATHETERS) IMPLANT
CATH X-FORCE N30 NEPHROSTOMY (TUBING) ×2 IMPLANT
COVER SURGICAL LIGHT HANDLE (MISCELLANEOUS) ×2 IMPLANT
COVER WAND RF STERILE (DRAPES) IMPLANT
DEVICE INFLATION 20CC 30ATM (MISCELLANEOUS) ×1 IMPLANT
DRAPE C-ARM 42X120 X-RAY (DRAPES) ×2 IMPLANT
DRAPE LINGEMAN PERC (DRAPES) ×2 IMPLANT
DRAPE SURG IRRIG POUCH 19X23 (DRAPES) ×2 IMPLANT
DRSG PAD ABDOMINAL 8X10 ST (GAUZE/BANDAGES/DRESSINGS) IMPLANT
DRSG TEGADERM 4X4.75 (GAUZE/BANDAGES/DRESSINGS) ×1 IMPLANT
DRSG TEGADERM 8X12 (GAUZE/BANDAGES/DRESSINGS) ×4 IMPLANT
EXTRACTOR STONE 1.7FRX115CM (UROLOGICAL SUPPLIES) ×2 IMPLANT
FIBER LASER FLEXIVA 1000 (UROLOGICAL SUPPLIES) IMPLANT
FIBER LASER FLEXIVA 365 (UROLOGICAL SUPPLIES) IMPLANT
FIBER LASER FLEXIVA 550 (UROLOGICAL SUPPLIES) IMPLANT
FIBER LASER TRAC TIP (UROLOGICAL SUPPLIES) IMPLANT
GAUZE SPONGE 2X2 8PLY STRL LF (GAUZE/BANDAGES/DRESSINGS) IMPLANT
GAUZE SPONGE 4X4 12PLY STRL (GAUZE/BANDAGES/DRESSINGS) IMPLANT
GLOVE BIOGEL M 8.0 STRL (GLOVE) ×2 IMPLANT
GOWN STRL REUS W/TWL XL LVL3 (GOWN DISPOSABLE) ×2 IMPLANT
GUIDEWIRE AMPLAZ .035X145 (WIRE) ×4 IMPLANT
GUIDEWIRE ANG ZIPWIRE 038X150 (WIRE) ×1 IMPLANT
GUIDEWIRE SENSOR ANG DUAL FLEX (WIRE) IMPLANT
GUIDEWIRE STR DUAL SENSOR (WIRE) ×2 IMPLANT
KIT BASIN OR (CUSTOM PROCEDURE TRAY) ×2 IMPLANT
KIT PROBE 340X3.4XDISP GRN (MISCELLANEOUS) IMPLANT
KIT PROBE TRILOGY 3.4X340 (MISCELLANEOUS)
KIT PROBE TRILOGY 3.9X350 (MISCELLANEOUS) ×1 IMPLANT
KIT TURNOVER KIT A (KITS) IMPLANT
MANIFOLD NEPTUNE II (INSTRUMENTS) ×2 IMPLANT
NS IRRIG 1000ML POUR BTL (IV SOLUTION) ×2 IMPLANT
PACK CYSTO (CUSTOM PROCEDURE TRAY) ×2 IMPLANT
SET IRRIG Y TYPE TUR BLADDER L (SET/KITS/TRAYS/PACK) IMPLANT
SHEATH PEELAWAY SET 9 (SHEATH) ×2 IMPLANT
SPONGE GAUZE 2X2 STER 10/PKG (GAUZE/BANDAGES/DRESSINGS) ×1
SPONGE LAP 4X18 RFD (DISPOSABLE) ×2 IMPLANT
STENT URET 6FRX26 CONTOUR (STENTS) ×1 IMPLANT
SURGIFLO W/THROMBIN 8M KIT (HEMOSTASIS) ×1 IMPLANT
SUT SILK 2 0 30  PSL (SUTURE) ×1
SUT SILK 2 0 30 PSL (SUTURE) ×1 IMPLANT
SYR 10ML LL (SYRINGE) ×2 IMPLANT
SYR 20ML LL LF (SYRINGE) ×4 IMPLANT
TRAY FOLEY MTR SLVR 14FR STAT (SET/KITS/TRAYS/PACK) IMPLANT
TRAY FOLEY MTR SLVR 16FR STAT (SET/KITS/TRAYS/PACK) ×2 IMPLANT
TUBING CONNECTING 10 (TUBING) ×4 IMPLANT
TUBING UROLOGY SET (TUBING) ×2 IMPLANT
WATER STERILE IRR 1000ML POUR (IV SOLUTION) ×2 IMPLANT

## 2019-04-06 NOTE — Interval H&P Note (Signed)
History and Physical Interval Note:  04/06/2019 11:20 AM  Tiffany Valentine  has presented today for surgery, with the diagnosis of RIGHT RENAL CALCULUS.  The various methods of treatment have been discussed with the patient and family. After consideration of risks, benefits and other options for treatment, the patient has consented to  Procedure(s) with comments: NEPHROLITHOTOMY PERCUTANEOUS (Right) - 2.5 HRS HOLMIUM LASER APPLICATION (Right) as a surgical intervention.  The patient's history has been reviewed, patient examined, no change in status, stable for surgery.  I have reviewed the patient's chart and labs.  Questions were answered to the patient's satisfaction.     Lillette Boxer Tay Whitwell

## 2019-04-06 NOTE — Discharge Instructions (Signed)
DISCHARGE INSTRUCTIONS FOR PERCUTANEOUS STONE SURGERY MEDICATIONS:  1. DO NOT RESUME YOUR IBUPROFEN, or any other medicines like aspirin, motrin, excedrin, advil, aleve, vitamin E, fish oil as these can all cause bleeding x 10 days.  2. Resume all your other meds from home - except do not take any other pain meds that you may have at home.  ACTIVITY 1. No strenuous activity, sexual activity, or lifting greater than 10 pounds for 2 weeks. 2. No driving while on narcotic pain medications 3. Drink plenty of water 4. Continue to walk at home - you can still get blood clots when you are at home, so keep active, but don't over do it. 5. May return to work in 1 week (but not heavy or strenuous activity).   BATHING You can shower.  Cover your wound with a dressing and remove the dressing immediately after the shower.  Do not submerge wound under water.   WOUND CARE Your wound will drain bloody fluid and may do so for 7-14 days. You have 2 options for dressings:  1. You may use gauze and tape to dress your wound.  If you choose this method, then change the dressing as it becomes soaked.  Change it at least once daily until it stops draining. You may switch to a Band Aid once drainage stops. 2. If drainage is copious, you may use an ostomy device.  This is a bag with an andhesive circle.  The circle has a hole in the middle of it and you cut the hole to the size needed to fit the wound.  This will collect the drainage in the bag and allow you to drain the bag as needed.   SIGNS/SYMPTOMS TO CALL: 1. Please call us if you have a fever greater than 101.5, uncontrolled nausea/vomiting, uncontrolled pain, dizziness, unable to urinate, bloody urine, chest pain, shortness of breath, leg swelling, leg pain, redness around wound, drainage from wound, or any other concerns or questions. 2. You can reach [place] at [phone]. FOLLOW-UP 1.  You will have a follow up appointment w/ Azucena Fallen as detailed on other  sheet

## 2019-04-06 NOTE — Anesthesia Procedure Notes (Signed)
Procedure Name: Intubation Date/Time: 04/06/2019 12:54 PM Performed by: West Pugh, CRNA Pre-anesthesia Checklist: Patient identified, Emergency Drugs available, Suction available, Patient being monitored and Timeout performed Patient Re-evaluated:Patient Re-evaluated prior to induction Oxygen Delivery Method: Circle system utilized Preoxygenation: Pre-oxygenation with 100% oxygen Induction Type: IV induction Ventilation: Mask ventilation without difficulty Laryngoscope Size: Mac and 3 Grade View: Grade I Tube type: Oral Tube size: 7.0 mm Number of attempts: 1 Airway Equipment and Method: Stylet Placement Confirmation: ETT inserted through vocal cords under direct vision,  positive ETCO2,  CO2 detector and breath sounds checked- equal and bilateral Secured at: 20 cm Tube secured with: Tape Dental Injury: Teeth and Oropharynx as per pre-operative assessment

## 2019-04-06 NOTE — Transfer of Care (Signed)
Immediate Anesthesia Transfer of Care Note  Patient: Tiffany Valentine  Procedure(s) Performed: NEPHROLITHOTOMY PERCUTANEOUS (Right )  Patient Location: PACU  Anesthesia Type:General  Level of Consciousness: awake, alert , oriented and patient cooperative  Airway & Oxygen Therapy: Patient Spontanous Breathing and Patient connected to face mask oxygen  Post-op Assessment: Report given to RN and Post -op Vital signs reviewed and stable  Post vital signs: Reviewed and stable  Last Vitals:  Vitals Value Taken Time  BP 116/77 04/06/19 1430  Temp    Pulse 72 04/06/19 1430  Resp 12 04/06/19 1430  SpO2 100 % 04/06/19 1430  Vitals shown include unvalidated device data.  Last Pain:  Vitals:   04/06/19 1209  TempSrc:   PainSc: 0-No pain         Complications: No apparent anesthesia complications

## 2019-04-06 NOTE — H&P (Signed)
Chief Complaint: Right renal calculi  Referring Physician(s): [place]  Supervising Physician: Jacqulynn Cadet  Patient Status: [facility] - Out-pt  History of Present Illness: Tiffany Valentine is a 63 y.o. female with right renal calculi who is here today for placement of a percutaneous nephrostomy tube then she will be going to the OR for nephrolithotomy by Dr. Diona Fanti.  She is NPO. No nausea/vomiting. No Fever/chills. ROS negative.  She does not take blood thinners.  Past Medical History:  Diagnosis Date  . Depression   . History of kidney stones    right renal calculus    Past Surgical History:  Procedure Laterality Date  . CHOLECYSTECTOMY  12/2018    Allergies: Patient has no known allergies.  Medications: Prior to Admission medications   Medication Sig Start Date End Date Taking? Authorizing Provider  alendronate (FOSAMAX) 70 MG tablet Take 70 mg by mouth every Friday. 01/16/19   [provider]  Biotin 1000 MCG tablet Take 1,000 mcg by mouth daily with lunch.    [provider]  buPROPion (WELLBUTRIN XL) 150 MG 24 hr tablet Take 150 mg by mouth daily with breakfast.  12/22/18   [provider]  Cholecalciferol (VITAMIN D-3) 125 MCG (5000 UT) TABS Take 5,000 Units by mouth daily with lunch.    [provider]  ibuprofen (ADVIL) 200 MG tablet Take 400 mg by mouth every 8 (eight) hours as needed (headaches/ocular migraines.).    [provider]  levothyroxine (SYNTHROID) 75 MCG tablet Take 75 mcg by mouth daily before breakfast. 01/16/19   [provider]  Multiple Vitamin (MULTIVITAMIN WITH MINERALS) TABS tablet Take 1 tablet by mouth daily with lunch. Women's Multivitamin 50+    [provider]     History reviewed. No pertinent family history.  Social History   Socioeconomic History  . Marital status: Married    Spouse name: Not on file  . Number of children: Not on file  . Years of  education: Not on file  . Highest education level: Not on file  Occupational History  . Not on file  Social Needs  . Financial resource strain: Not on file  . Food insecurity    Worry: Not on file    Inability: Not on file  . Transportation needs    Medical: Not on file    Non-medical: Not on file  Tobacco Use  . Smoking status: Never Smoker  . Smokeless tobacco: Never Used  Substance and Sexual Activity  . Alcohol use: Yes    Comment: social  . Drug use: Never  . Sexual activity: Not on file  Lifestyle  . Physical activity    Days per week: Not on file    Minutes per session: Not on file  . Stress: Not on file  Relationships  . Social Herbalist on phone: Not on file    Gets together: Not on file    Attends religious service: Not on file    Active member of club or organization: Not on file    Attends meetings of clubs or organizations: Not on file    Relationship status: Not on file  Other Topics Concern  . Not on file  Social History Narrative  . Not on file     Review of Systems: A 12 point ROS discussed and pertinent positives are indicated in the HPI above.  All other systems are negative.  Review of Systems  Vital Signs: BP 120/80  Pulse 81   Temp 98.3 F (36.8 C) (Oral)   Resp 18   SpO2 100%   Physical Exam Vitals signs reviewed.  Constitutional:      Appearance: Normal appearance.  HENT:     Head: Normocephalic and atraumatic.  Eyes:     Extraocular Movements: Extraocular movements intact.  Neck:     Musculoskeletal: Normal range of motion.  Cardiovascular:     Rate and Rhythm: Normal rate and regular rhythm.  Pulmonary:     Effort: Pulmonary effort is normal.     Breath sounds: Normal breath sounds.  Abdominal:     Palpations: Abdomen is soft.  Musculoskeletal: Normal range of motion.  Skin:    General: Skin is warm and dry.  Neurological:     General: No focal deficit present.     Mental Status: She is alert and oriented  to person, place, and time.  Psychiatric:        Mood and Affect: Mood normal.        Behavior: Behavior normal.        Thought Content: Thought content normal.        Judgment: Judgment normal.     Imaging: No results found.  Labs:  CBC: Recent Labs    04/03/19 0841  WBC 6.8  HGB 14.0  HCT 43.7  PLT 295    COAGS: Recent Labs    04/06/19 0846  INR 1.1  APTT 34    BMP: Recent Labs    04/03/19 0841  NA 140  K 4.4  CL 106  CO2 26  GLUCOSE 104*  BUN 12  CALCIUM 9.4  CREATININE 0.68  GFRNONAA >60  GFRAA >60    LIVER FUNCTION TESTS: No results for input(s): BILITOT, AST, ALT, ALKPHOS, PROT, ALBUMIN in the last 8760 hours.  TUMOR MARKERS: No results for input(s): AFPTM, CEA, CA199, CHROMGRNA in the last 8760 hours.  Assessment and Plan:  Right renal calculi  Will proceed with image guided placement of a percutaneous nephrostomy tube today by Dr. Laurence Ferrari.  Risks and benefits of right PCN placement was discussed with the patient including, but not limited to, infection, bleeding, significant bleeding causing loss or decrease in renal function or damage to adjacent structures.   All of the patient's questions were answered, patient is agreeable to proceed.  Consent signed and in chart.  Thank you for this interesting consult.  I greatly enjoyed meeting Tiffany Valentine and look forward to participating in their care.  A copy of this report was sent to the requesting provider on this date.  Electronically Signed: Murrell Redden, PA-C   04/06/2019, 9:45 AM      I spent a total of  30 Minutes   in face to face in clinical consultation, greater than 50% of which was counseling/coordinating care for right PCN placement.

## 2019-04-06 NOTE — Anesthesia Postprocedure Evaluation (Signed)
Anesthesia Post Note  Patient: Tiffany Valentine  Procedure(s) Performed: NEPHROLITHOTOMY PERCUTANEOUS (Right )     Patient location during evaluation: PACU Anesthesia Type: General Level of consciousness: sedated and patient cooperative Pain management: pain level controlled Vital Signs Assessment: post-procedure vital signs reviewed and stable Respiratory status: spontaneous breathing Cardiovascular status: stable Anesthetic complications: no    Last Vitals:  Vitals:   04/06/19 1530 04/06/19 1545  BP: 127/76 132/78  Pulse: 62 60  Resp: 14 16  Temp: 36.4 C   SpO2: 98% 98%    Last Pain:  Vitals:   04/06/19 1617  TempSrc:   PainSc: 3                  Nolon Nations

## 2019-04-06 NOTE — Op Note (Signed)
Preoperative diagnosis: 23 mm right renal pelvic stone  Postoperative diagnosis: Same  Principal procedure: Percutaneous nephrolithotomy of right renal calculus, 23 mm in size  Surgeon: Prescott Truex  Anesthesia: General endotracheal  Complications: None  Drains: 26 cm x 6 French contour double-J stent and right ureter, Foley catheter  Estimated blood loss: Less than 100 mL  Specimen: Stone fragments  Indications: 63 year old female with a large right renal pelvic stone, 23 mm in size.  Although this has not been symptomatic, she has significant pelvic dilation and with this stone in her age, it was recommended that she have the stone removed.  We have talked about treatment options including ureteroscopy, shockwave lithotripsy and percutaneous management.  Due to the size and location of the stone, I have recommended percutaneous nephrolithotomy.  I have discussed the procedure as well as risks and complications with the patient.  These include but are not limited to renal injury, bleeding, renal loss, infection, need for stenting, need for percutaneous tube, need for second procedure, anesthetic complications, among others.  She understands these and desires to proceed.  Findings: The renal pelvis was quite dilated.  There was a solitary stone, mulberry type, within the renal pelvis.  No urothelial lesions of the pyelocalyceal system were noted.  Description of procedure: The patient was properly identified and marked in the holding area after she had the nephrostomy tube placed by Dr. Amelia Jo.  She received preoperative IV antibiotics within the interventional radiology suite.  She was then taken to the operating room where general anesthetic was administered with the endotracheal device.  Foley catheter was placed in the bladder.  She was then placed in the prone position, gently padded with all extremities and pressure points properly protected.  At this point the right flank was prepped  around the Kumpe catheter which had been left in place.  She was then draped.  Proper timeout was performed.  A Super Stiff guidewire was passed through the Kumpe catheter with fluoroscopic localization of the distal tip being in the bladder.  At this point the Kumpe catheter was removed.  Skin was incised for approximately 1-1/2 cm beside the guidewire.  I then passed a peel-away access sheath over top of the guidewire into the mid ureter using fluoroscopic guidance.  The core was removed, and a second guidewire was then placed fluoroscopically into the bladder.  The peel-away sheath was removed.  I then passed the 26 [place] NephroMax balloon over top of the guidewire into the renal pelvis using fluoroscopic guidance.  Balloon and then inflated to 20 atm of pressure, the nephrostomy access sheath was then passed over top of the inflated balloon into the renal pelvis.  The balloon was then deflated and removed.  Nephroscope was then passed into the renal pelvis where the stone was easily seen.  Inspection was then carried out and no further calculi were noted.  The trilogy device was used to fragment the stone into multiple smaller fragments which were then carefully removed from the renal pelvis.  Small fragments were then aspirated with the same truly did advise using the ultrasonic settings and the aspirator.  No further stone fragments were seen.  At this point, the flexible nephroscope was utilized to inspect the pelvis and the calyceal systems.  No fragments were seen at this point either.  I then passed a 26 cm x 6 French contour double-J stent using fluoroscopic guidance into the bladder.  Once the guidewire was removed, there was an excellent curl seen  within the bladder and in the renal pelvis.  The safety guidewire was then removed.  I measured the distance between the lateral edge of the calyx and the skin.  I then passed the laparoscopic trocar for the FloSeal through the nephrostomy sheath.  The  sheath was then removed.  I then injected the FloSeal through the nephrostomy tract, first on the edge of the kidney and then out through the incision.  The entire 10 cc of FloSeal was placed.  There was adequate hemostasis.  I then sutured the incision with 2 separate vertical mattress sutures placed with 2-0 silk.  At this point dry sterile dressing was placed.  The patient was then returned to the supine position.  She was awakened and then extubated.  She was then transported to the PACU.  She tolerated procedure well.

## 2019-04-06 NOTE — Anesthesia Preprocedure Evaluation (Signed)
Anesthesia Evaluation  Patient identified by MRN, date of birth, ID band Patient awake    Reviewed: Allergy & Precautions, NPO status , Patient's Chart, lab work & pertinent test results  Airway Mallampati: II  TM Distance: >3 FB Neck ROM: Full    Dental no notable dental hx. (+) Dental Advisory Given   Pulmonary neg pulmonary ROS,    Pulmonary exam normal breath sounds clear to auscultation       Cardiovascular negative cardio ROS Normal cardiovascular exam Rhythm:Regular Rate:Normal     Neuro/Psych PSYCHIATRIC DISORDERS Depression negative neurological ROS     GI/Hepatic negative GI ROS, Neg liver ROS,   Endo/Other  negative endocrine ROS  Renal/GU negative Renal ROS     Musculoskeletal negative musculoskeletal ROS (+)   Abdominal   Peds  Hematology negative hematology ROS (+)   Anesthesia Other Findings   Reproductive/Obstetrics negative OB ROS                             Anesthesia Physical Anesthesia Plan  ASA: II  Anesthesia Plan: General   Post-op Pain Management:    Induction: Intravenous  PONV Risk Score and Plan: 4 or greater and Ondansetron, Dexamethasone and Treatment may vary due to age or medical condition  Airway Management Planned: Oral ETT  Additional Equipment: None  Intra-op Plan:   Post-operative Plan: Extubation in OR  Informed Consent: I have reviewed the patients History and Physical, chart, labs and discussed the procedure including the risks, benefits and alternatives for the proposed anesthesia with the patient or authorized representative who has indicated his/her understanding and acceptance.     Dental advisory given  Plan Discussed with: CRNA  Anesthesia Plan Comments:         Anesthesia Quick Evaluation

## 2019-04-06 NOTE — Procedures (Signed)
Interventional Radiology Procedure Note  Procedure: RIGHT percutaneous nephrostomy and placement of a nephroureteral tube for PCNL access.   Complications: None  Estimated Blood Loss: None  Recommendations: - To OR for PCNL   Signed,  Criselda Peaches, MD

## 2019-04-07 ENCOUNTER — Encounter (HOSPITAL_COMMUNITY): Payer: Self-pay | Admitting: Urology

## 2019-04-07 DIAGNOSIS — N200 Calculus of kidney: Secondary | ICD-10-CM | POA: Diagnosis not present

## 2019-04-07 DIAGNOSIS — Z79899 Other long term (current) drug therapy: Secondary | ICD-10-CM | POA: Diagnosis not present

## 2019-04-07 DIAGNOSIS — Z20828 Contact with and (suspected) exposure to other viral communicable diseases: Secondary | ICD-10-CM | POA: Diagnosis not present

## 2019-04-07 DIAGNOSIS — F329 Major depressive disorder, single episode, unspecified: Secondary | ICD-10-CM | POA: Diagnosis not present

## 2019-04-07 LAB — HEMOGLOBIN AND HEMATOCRIT, BLOOD
HCT: 40.1 % (ref 36.0–46.0)
Hemoglobin: 12.8 g/dL (ref 12.0–15.0)

## 2019-04-07 LAB — HIV ANTIBODY (ROUTINE TESTING W REFLEX): HIV Screen 4th Generation wRfx: NONREACTIVE

## 2019-04-07 MED ORDER — TRAMADOL HCL 50 MG PO TABS: 50.0000 mg | ORAL_TABLET | Freq: Four times a day (QID) | ORAL | 0 refills | Status: AC | PRN

## 2019-04-07 MED ORDER — PHENOL 1.4 % MT LIQD
1.0000 | OROMUCOSAL | Status: DC | PRN
  Administered 2019-04-07: 1 via OROMUCOSAL
  Filled 2019-04-07: qty 177

## 2019-04-07 NOTE — Progress Notes (Signed)
Patient discharged home with husband. IV removed - WNL.  Reviewed AVS and medications.  Follow up in place.  Instructed to call MD if fever, increased pain, or purulent drainage occurs. Encouraged to drink fluids and ambulate to prevent DVT. Patient verbalizes understanding - all questions answered satisfactorily.  patient assisted off unit in NAD.

## 2019-04-13 DIAGNOSIS — F331 Major depressive disorder, recurrent, moderate: Secondary | ICD-10-CM | POA: Diagnosis not present

## 2019-04-13 DIAGNOSIS — F411 Generalized anxiety disorder: Secondary | ICD-10-CM | POA: Diagnosis not present

## 2019-04-20 DIAGNOSIS — N200 Calculus of kidney: Secondary | ICD-10-CM | POA: Diagnosis not present

## 2019-04-20 DIAGNOSIS — R8271 Bacteriuria: Secondary | ICD-10-CM | POA: Diagnosis not present

## 2019-04-27 NOTE — Discharge Summary (Signed)
Patient ID: Tiffany Valentine MRN: [id number] DOB/AGE: [birth year] 63 y.o.  Admit date: 04/06/2019 Discharge date: 04/27/2019  Primary Care Physician:  Leighton Ruff, MD  Discharge Diagnoses:  Right renal stone   Consults:  None   Discharge Medications: Allergies as of 04/07/2019   No Known Allergies     Medication List    TAKE these medications   alendronate 70 MG tablet Commonly known as: FOSAMAX Take 70 mg by mouth every Friday.   Biotin 1000 MCG tablet Take 1,000 mcg by mouth daily with lunch.   buPROPion 150 MG 24 hr tablet Commonly known as: WELLBUTRIN XL Take 150 mg by mouth daily with breakfast.   ibuprofen 200 MG tablet Commonly known as: ADVIL Take 400 mg by mouth every 8 (eight) hours as needed (headaches/ocular migraines.).   levothyroxine 75 MCG tablet Commonly known as: SYNTHROID Take 75 mcg by mouth daily before breakfast.   multivitamin with minerals Tabs tablet Take 1 tablet by mouth daily with lunch. Women's Multivitamin 50+   traMADol 50 MG tablet Commonly known as: Ultram Take 1 tablet (50 mg total) by mouth every 6 (six) hours as needed.   Vitamin D-3 125 MCG (5000 UT) Tabs Take 5,000 Units by mouth daily with lunch.        Significant Diagnostic Studies:  Dg C-arm 1-60 Min-no Report  Result Date: 04/06/2019 Fluoroscopy was utilized by the requesting physician.  No radiographic interpretation.   Ir Ureteral Stent Right New Access W/o Sep Nephrostomy Cath  Result Date: 04/06/2019 INDICATION: 63 year old female with a right renal multiparity calculus consistent with calcium oxalate stone. She presents for percutaneous nephroureteral access in preparation for PCNL. EXAM: 1. Percutaneous puncture of the renal collecting system under fluoroscopic guidance 2. Placement of a percutaneous nephrostomy tube Operating Physician:  Criselda Peaches, MD COMPARISON:  CT of the abdomen and pelvis-12/24/2018 MEDICATIONS: Ciprofloxacin 400 mg IV; The  antibiotic was administered in an appropriate time frame prior to skin puncture. ANESTHESIA/SEDATION: Fentanyl 100 mcg IV; Versed 3 mg IV Moderate Sedation Time:  30 minutes The patient was continuously monitored during the procedure by the interventional radiology nurse under my direct supervision. CONTRAST:  64mL OMNIPAQUE IOHEXOL 300 MG/ML SOLN - administered into the collecting system(s) FLUOROSCOPY TIME:  Fluoroscopy Time: 9 minutes 0 seconds (141 mGy). COMPLICATIONS: None immediate. PROCEDURE: Informed written consent was obtained from the patient after a thorough discussion of the procedural risks, benefits and alternatives. All questions were addressed. Maximal Sterile Barrier Technique was utilized including caps, mask, sterile gowns, sterile gloves, sterile drape, hand hygiene and skin antiseptic. A timeout was performed prior to the initiation of the procedure. A pre procedural spot fluoroscopic image was obtained of the upper abdomen. Ultrasound scanning performed of the kidney was negative for significant hydronephrosis. As such, the stone within the renal pelvis was targeted fluoroscopically with a Jameson needle. Access to the collecting system was confirmed with advancement of a Nitrex wire into the collecting system. The needle was exchanged for the inner 3 French catheter from an [place] and contrast injection confirmed access. A small amount of air was injected into the collecting system to help delineate a posterior calyx. A posterior inferior calyx was targeted with a 22 gauge Chiba needle. Access to the calyx was confirmed with advancement of a Nitrex wire into the collecting system. An Accustick set was utilized to dilate the tract and was subsequently exchanged for a Kumpe catheter over a Bentson wire. The Kumpe catheter was advanced  down the ureter and into the urinary bladder. Postprocedural spot radiographs were obtained in various obliquities and the catheter was sutured to  the skin. The catheter was capped and a dressing was placed. The patient tolerated the procedure well without immediate postprocedural complication. IMPRESSION: Successful fluoroscopic guided right percutaneous nephrostomy with placement of a 5 French Kumpe catheter to the level of the urinary bladder to be utilized during impending nephrolithotomy procedure. Signed, Criselda Peaches, MD, [facility] Radiology Electronically Signed   By: Jacqulynn Cadet M.D.   On: 04/06/2019 11:54    Brief H and P: For complete details please refer to admission H and P, but in brief pt admitted for mgmt of a large right renal stone.  Hospital Course: Uncomplicated right PCNL on day of admission. She was d/c'ed on POD 1 Active Problems:   Kidney calculus   Day of Discharge BP 122/74 (BP Location: Left Arm)   Pulse 76   Temp 97.9 F (36.6 C) (Oral)   Resp 16   Ht 5\' 7"  (1.702 m)   Wt 57.3 kg   SpO2 95%   BMI 19.79 kg/m   No results found for this or any previous visit (from the past 24 hour(s)).  Physical Exam: General: Alert and awake oriented x3 not in any acute distress. HEENT: anicteric sclera, pupils reactive to light and accommodation CVS: S1-S2 clear no murmur rubs or gallops Chest: clear to auscultation bilaterally, no wheezing rales or rhonchi Abdomen: soft nontender, nondistended, normal bowel sounds, no organomegaly Extremities: no cyanosis, clubbing or edema noted bilaterally Neuro: Cranial nerves II-XII intact, no focal neurological deficits  Disposition:  Home  Diet:  Regular  Activity:  Discussed w/ pt   Disposition and Follow-up:     [place] arranged  TESTS THAT NEED FOLLOW-UP   N/A  DISCHARGE FOLLOW-UP  Follow-up Information    Jed Limerick, NP.   Specialty: Urology Why: [phone] @ 0800 Contact information: [address] [place] [address] [phone]           Time spent on  Discharge:   10 mins  Signed: Lillette Boxer Braylee Lal 04/27/2019, 3:49 AM

## 2019-05-17 DIAGNOSIS — N200 Calculus of kidney: Secondary | ICD-10-CM | POA: Diagnosis not present

## 2019-05-31 DIAGNOSIS — N200 Calculus of kidney: Secondary | ICD-10-CM | POA: Diagnosis not present

## 2019-07-03 DIAGNOSIS — Z20828 Contact with and (suspected) exposure to other viral communicable diseases: Secondary | ICD-10-CM | POA: Diagnosis not present

## 2019-07-03 DIAGNOSIS — R509 Fever, unspecified: Secondary | ICD-10-CM | POA: Diagnosis not present

## 2019-07-04 DIAGNOSIS — Z20828 Contact with and (suspected) exposure to other viral communicable diseases: Secondary | ICD-10-CM | POA: Diagnosis not present

## 2019-08-17 DIAGNOSIS — R7303 Prediabetes: Secondary | ICD-10-CM | POA: Diagnosis not present

## 2019-08-17 DIAGNOSIS — E7800 Pure hypercholesterolemia, unspecified: Secondary | ICD-10-CM | POA: Diagnosis not present

## 2019-08-17 DIAGNOSIS — E039 Hypothyroidism, unspecified: Secondary | ICD-10-CM | POA: Diagnosis not present

## 2019-09-14 ENCOUNTER — Ambulatory Visit: Payer: BC Managed Care – PPO | Attending: Internal Medicine

## 2019-09-14 DIAGNOSIS — Z23 Encounter for immunization: Secondary | ICD-10-CM | POA: Insufficient documentation

## 2019-09-14 NOTE — Progress Notes (Signed)
   Covid-19 Vaccination Clinic  Name:  Tiffany Valentine    MRN: [id number] DOB: 1956-07-24  09/14/2019  [name] was observed post Covid-19 immunization for 15 minutes without incidence. She was provided with Vaccine Information Sheet and instruction to access the V-Safe system.   [name] was instructed to call 911 with any severe reactions post vaccine: Marland Kitchen Difficulty breathing  . Swelling of your face and throat  . A fast heartbeat  . A bad rash all over your body  . Dizziness and weakness    Immunizations Administered    Name Date Dose VIS Date Route   Pfizer COVID-19 Vaccine 09/14/2019 10:07 AM 0.3 mL 07/14/2019 Intramuscular   Manufacturer: [place]   Lot: [id number]   [place]: [id number]

## 2019-10-05 DIAGNOSIS — F411 Generalized anxiety disorder: Secondary | ICD-10-CM | POA: Diagnosis not present

## 2019-10-05 DIAGNOSIS — F331 Major depressive disorder, recurrent, moderate: Secondary | ICD-10-CM | POA: Diagnosis not present

## 2019-10-11 ENCOUNTER — Ambulatory Visit: Payer: BC Managed Care – PPO | Attending: Internal Medicine

## 2019-10-11 DIAGNOSIS — Z23 Encounter for immunization: Secondary | ICD-10-CM

## 2019-10-11 NOTE — Progress Notes (Signed)
   Covid-19 Vaccination Clinic  Name:  Tiffany Valentine    MRN: [id number] DOB: 1955/09/14  10/11/2019  Ms. Tiffany Valentine was observed post Covid-19 immunization for 15 minutes without incident. She was provided with Vaccine Information Sheet and instruction to access the V-Safe system.   Ms. Tiffany Valentine was instructed to call 911 with any severe reactions post vaccine: Marland Kitchen Difficulty breathing  . Swelling of face and throat  . A fast heartbeat  . A bad rash all over body  . Dizziness and weakness   Immunizations Administered    Name Date Dose VIS Date Route   Pfizer COVID-19 Vaccine 10/11/2019 11:46 AM 0.3 mL 07/14/2019 Intramuscular   Manufacturer: [place]   Lot: [id number]   [place]: [id number]

## 2019-11-15 DIAGNOSIS — N200 Calculus of kidney: Secondary | ICD-10-CM | POA: Diagnosis not present

## 2020-03-14 DIAGNOSIS — R7303 Prediabetes: Secondary | ICD-10-CM | POA: Diagnosis not present

## 2020-03-14 DIAGNOSIS — E039 Hypothyroidism, unspecified: Secondary | ICD-10-CM | POA: Diagnosis not present

## 2020-03-14 DIAGNOSIS — M8580 Other specified disorders of bone density and structure, unspecified site: Secondary | ICD-10-CM | POA: Diagnosis not present

## 2020-03-14 DIAGNOSIS — Z0000 Encounter for general adult medical examination without abnormal findings: Secondary | ICD-10-CM | POA: Diagnosis not present

## 2020-03-14 DIAGNOSIS — E7800 Pure hypercholesterolemia, unspecified: Secondary | ICD-10-CM | POA: Diagnosis not present

## 2020-03-20 ENCOUNTER — Other Ambulatory Visit: Payer: Self-pay | Admitting: Family Medicine

## 2020-03-20 DIAGNOSIS — Z1231 Encounter for screening mammogram for malignant neoplasm of breast: Secondary | ICD-10-CM

## 2020-03-20 DIAGNOSIS — M8580 Other specified disorders of bone density and structure, unspecified site: Secondary | ICD-10-CM

## 2020-04-16 DIAGNOSIS — F331 Major depressive disorder, recurrent, moderate: Secondary | ICD-10-CM | POA: Diagnosis not present

## 2020-04-16 DIAGNOSIS — F411 Generalized anxiety disorder: Secondary | ICD-10-CM | POA: Diagnosis not present

## 2020-05-29 ENCOUNTER — Ambulatory Visit
Admission: RE | Admit: 2020-05-29 | Discharge: 2020-05-29 | Disposition: A | Discharge status: Home / Self Care | Payer: BC Managed Care – PPO | Source: Ambulatory Visit | Attending: Family Medicine | Admitting: Family Medicine

## 2020-05-29 ENCOUNTER — Other Ambulatory Visit: Payer: Self-pay

## 2020-05-29 DIAGNOSIS — Z1231 Encounter for screening mammogram for malignant neoplasm of breast: Secondary | ICD-10-CM | POA: Diagnosis not present

## 2020-05-29 DIAGNOSIS — M810 Age-related osteoporosis without current pathological fracture: Secondary | ICD-10-CM | POA: Diagnosis not present

## 2020-05-29 DIAGNOSIS — M8580 Other specified disorders of bone density and structure, unspecified site: Secondary | ICD-10-CM

## 2020-05-29 DIAGNOSIS — Z780 Asymptomatic menopausal state: Secondary | ICD-10-CM | POA: Diagnosis not present

## 2020-05-29 DIAGNOSIS — M8588 Other specified disorders of bone density and structure, other site: Secondary | ICD-10-CM | POA: Diagnosis not present

## 2020-07-03 ENCOUNTER — Ambulatory Visit: Payer: BC Managed Care – PPO

## 2020-07-09 DIAGNOSIS — R399 Unspecified symptoms and signs involving the genitourinary system: Secondary | ICD-10-CM | POA: Diagnosis not present

## 2020-09-05 IMAGING — XA IR URETURAL STENT RIGHT NEW ACCESS W/O SEP NEPHROSTOMY CATH
2 series · 3 of 3 positions shown · IV contrast (IODINE)
Comparison: CT of the abdomen and pelvis-12/24/2018

INDICATION: 63-year-old female with a right renal multiparity calculus
consistent with calcium oxalate stone. She presents for percutaneous
nephroureteral access in preparation for PCNL.

EXAM:
1. Percutaneous puncture of the renal collecting system under
fluoroscopic guidance
2. Placement of a percutaneous nephrostomy tube

[Series 2: care body 4 · 1 of 1 slices shown]
[im 1/1]
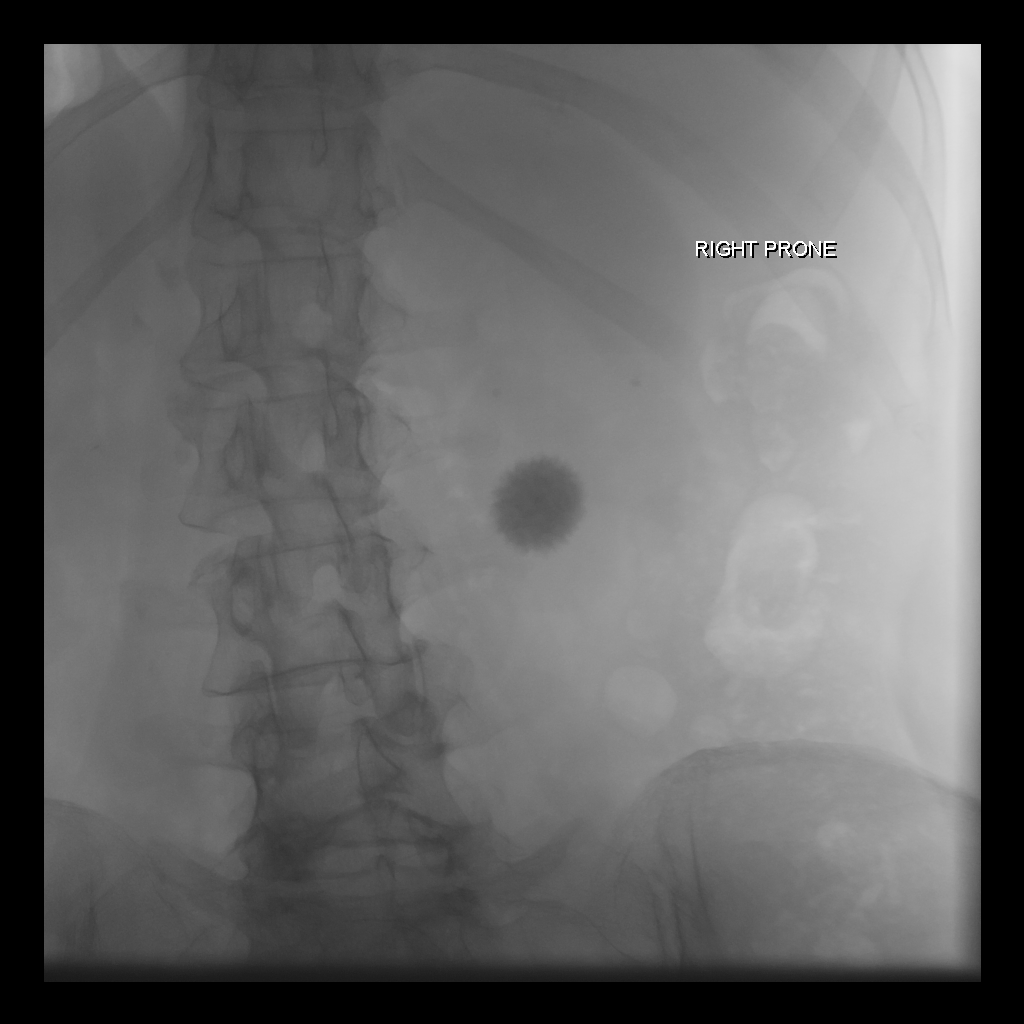

[Series 300: ir ureteral stent placement existing acc · 2 of 2 slices shown]
[im 1/2]
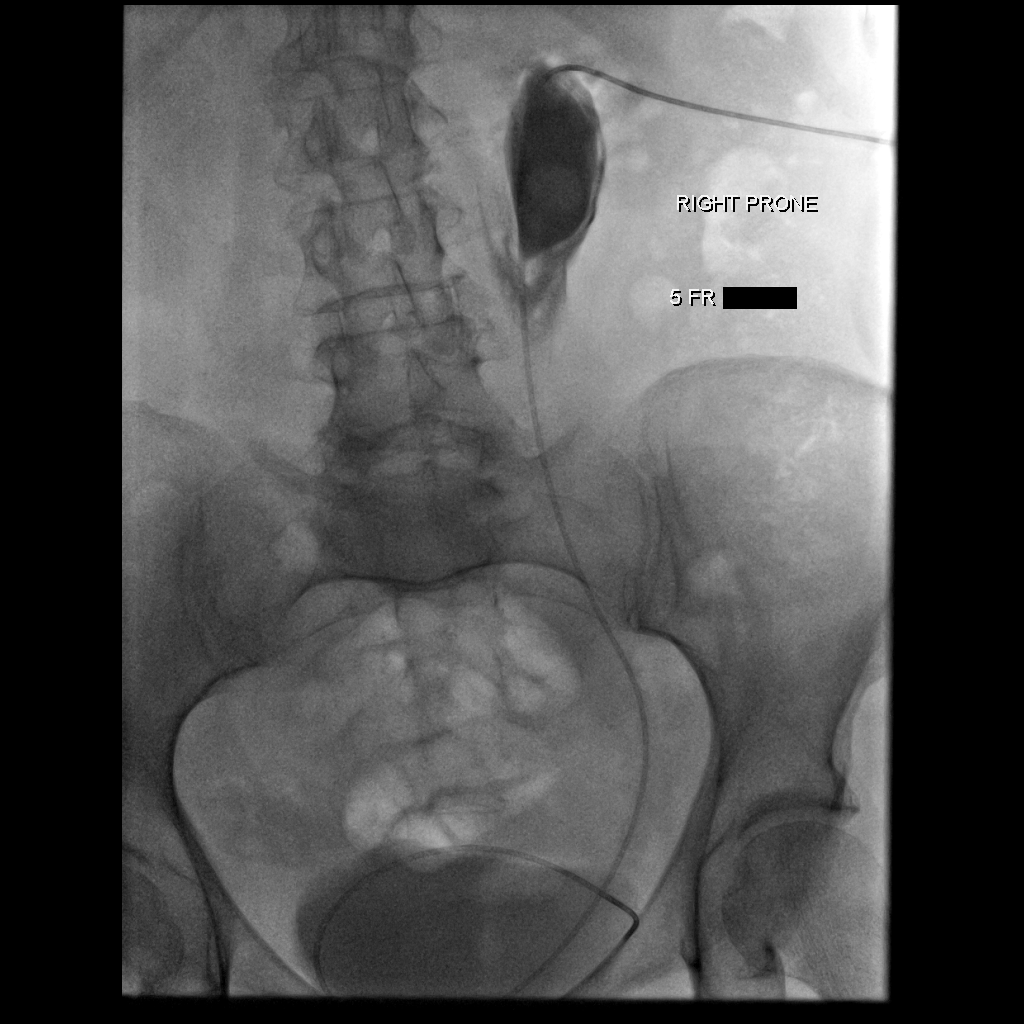
[im 2/2]
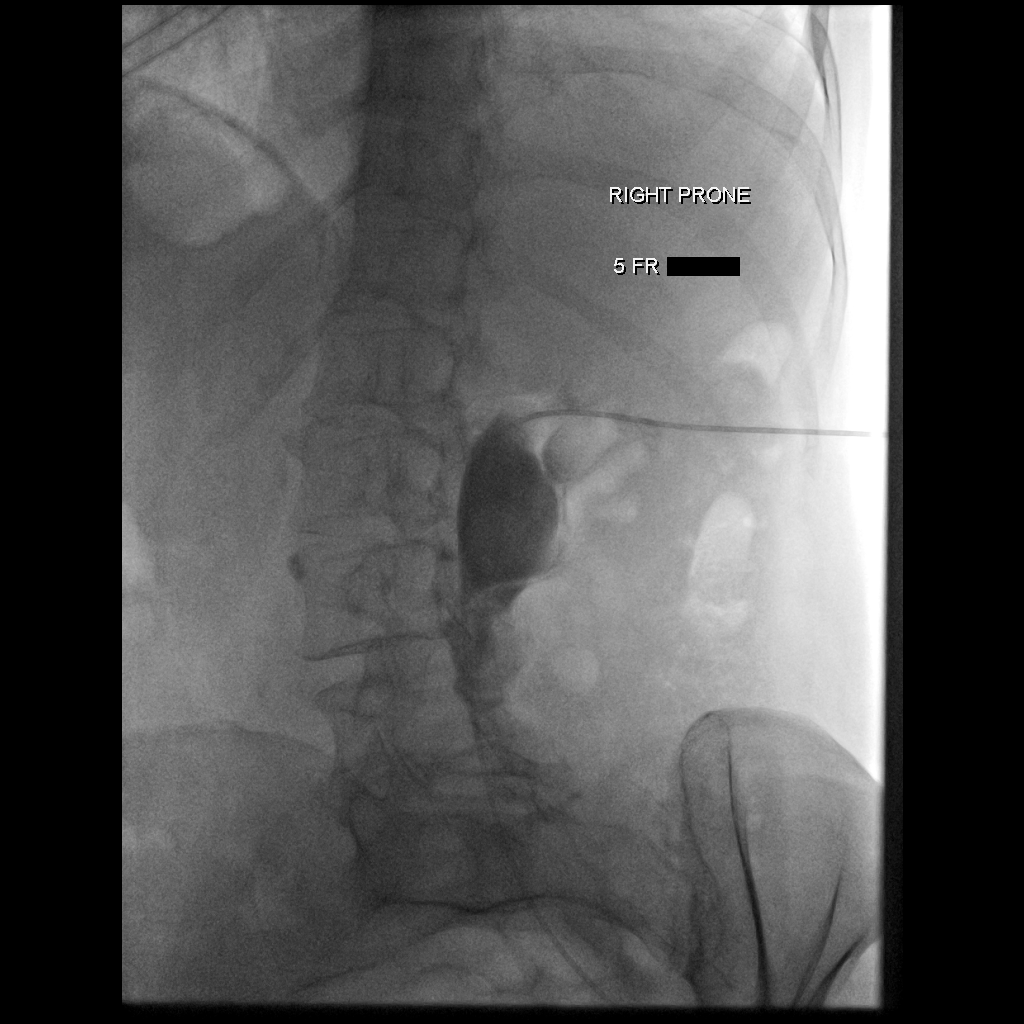

[3 of 3 positions shown; findings below may reference images not displayed]

MEDICATIONS:
Ciprofloxacin 400 mg IV; The antibiotic was administered in an
appropriate time frame prior to skin puncture.

ANESTHESIA/SEDATION:
Fentanyl 100 mcg IV; Versed 3 mg IV

Moderate Sedation Time:  30 minutes

The patient was continuously monitored during the procedure by the
interventional radiology nurse under my direct supervision.

CONTRAST:  20mL OMNIPAQUE IOHEXOL 300 MG/ML SOLN - administered into
the collecting system(s)

FLUOROSCOPY TIME:  Fluoroscopy Time: 9 minutes 0 seconds (141 mGy).

COMPLICATIONS:
None immediate.

PROCEDURE:
Informed written consent was obtained from the patient after a
thorough discussion of the procedural risks, benefits and
alternatives. All questions were addressed. Maximal Sterile Barrier
Technique was utilized including caps, mask, sterile gowns, sterile
gloves, sterile drape, hand hygiene and skin antiseptic. A timeout
was performed prior to the initiation of the procedure.

A pre procedural spot fluoroscopic image was obtained of the upper
abdomen. Ultrasound scanning performed of the kidney was negative
for significant hydronephrosis. As such, the stone within the renal
pelvis was targeted fluoroscopically with a 22 gauge Chiba needle.
Access to the collecting system was confirmed with advancement of a
Nitrex wire into the collecting system. The needle was exchanged for
the inner 3 French catheter from an Accustick set and contrast
injection confirmed access. A small amount of air was injected into
the collecting system to help delineate a posterior calyx. A
posterior inferior calyx was targeted with a 22 gauge Chiba needle.
Access to the calyx was confirmed with advancement of a Nitrex wire
into the collecting system. An Accustick set was utilized to dilate
the tract and was subsequently exchanged for a Kumpe catheter over a
Bentson wire. The Kumpe catheter was advanced down the ureter and
into the urinary bladder. Postprocedural spot radiographs were
obtained in various obliquities and the catheter was sutured to the
skin. The catheter was capped and a dressing was placed. The patient
tolerated the procedure well without immediate postprocedural
complication.
IMPRESSION: Successful fluoroscopic guided right percutaneous nephrostomy with
placement of a 5 French Kumpe catheter to the level of the urinary
bladder to be utilized during impending nephrolithotomy procedure.

## 2020-10-15 DIAGNOSIS — F331 Major depressive disorder, recurrent, moderate: Secondary | ICD-10-CM | POA: Diagnosis not present

## 2020-10-15 DIAGNOSIS — F411 Generalized anxiety disorder: Secondary | ICD-10-CM | POA: Diagnosis not present

## 2020-11-22 DIAGNOSIS — R8271 Bacteriuria: Secondary | ICD-10-CM | POA: Diagnosis not present

## 2020-11-22 DIAGNOSIS — N200 Calculus of kidney: Secondary | ICD-10-CM | POA: Diagnosis not present

## 2020-11-25 ENCOUNTER — Other Ambulatory Visit: Payer: Self-pay | Admitting: Urology

## 2020-11-25 DIAGNOSIS — N200 Calculus of kidney: Secondary | ICD-10-CM

## 2021-01-08 DIAGNOSIS — N200 Calculus of kidney: Secondary | ICD-10-CM | POA: Diagnosis not present

## 2021-01-08 DIAGNOSIS — R8271 Bacteriuria: Secondary | ICD-10-CM | POA: Diagnosis not present

## 2021-01-14 NOTE — Progress Notes (Signed)
Patient to arrive at 0600 on 01/20/2021. History and medications reviewed. Pre-procedure instructions given. NPO after MN on Sunday except for clear liquids until 0400. Driver secured.

## 2021-01-17 ENCOUNTER — Other Ambulatory Visit (HOSPITAL_COMMUNITY): Payer: BC Managed Care – PPO

## 2021-01-20 ENCOUNTER — Ambulatory Visit (HOSPITAL_BASED_OUTPATIENT_CLINIC_OR_DEPARTMENT_OTHER)
Admission: RE | Admit: 2021-01-20 | Discharge: 2021-01-20 | Disposition: A | Discharge status: Home / Self Care | Payer: BC Managed Care – PPO | Attending: Urology | Admitting: Urology

## 2021-01-20 ENCOUNTER — Encounter (HOSPITAL_BASED_OUTPATIENT_CLINIC_OR_DEPARTMENT_OTHER)
Admission: RE | Disposition: A | Discharge status: Home / Self Care | Payer: Self-pay | Source: Home / Self Care | Attending: Urology

## 2021-01-20 ENCOUNTER — Other Ambulatory Visit: Payer: Self-pay

## 2021-01-20 ENCOUNTER — Ambulatory Visit (HOSPITAL_COMMUNITY): Payer: BC Managed Care – PPO

## 2021-01-20 ENCOUNTER — Encounter (HOSPITAL_BASED_OUTPATIENT_CLINIC_OR_DEPARTMENT_OTHER): Payer: Self-pay | Admitting: Urology

## 2021-01-20 DIAGNOSIS — N200 Calculus of kidney: Secondary | ICD-10-CM | POA: Insufficient documentation

## 2021-01-20 DIAGNOSIS — Z79899 Other long term (current) drug therapy: Secondary | ICD-10-CM | POA: Insufficient documentation

## 2021-01-20 HISTORY — PX: EXTRACORPOREAL SHOCK WAVE LITHOTRIPSY: SHX1557

## 2021-01-20 SURGERY — LITHOTRIPSY, ESWL
Anesthesia: LOCAL | Laterality: Left

## 2021-01-20 MED ORDER — DIAZEPAM 5 MG PO TABS
ORAL_TABLET | ORAL | Status: AC
  Filled 2021-01-20: qty 2

## 2021-01-20 MED ORDER — HYDROCODONE-ACETAMINOPHEN 5-325 MG PO TABS: 1.0000 | ORAL_TABLET | ORAL | 0 refills | Status: AC | PRN

## 2021-01-20 MED ORDER — DIPHENHYDRAMINE HCL 25 MG PO CAPS
25.0000 mg | ORAL_CAPSULE | ORAL | Status: AC
  Administered 2021-01-20: 25 mg via ORAL

## 2021-01-20 MED ORDER — DIPHENHYDRAMINE HCL 25 MG PO CAPS
ORAL_CAPSULE | ORAL | Status: AC
  Filled 2021-01-20: qty 1

## 2021-01-20 MED ORDER — CIPROFLOXACIN HCL 500 MG PO TABS
500.0000 mg | ORAL_TABLET | ORAL | Status: AC
  Administered 2021-01-20: 500 mg via ORAL

## 2021-01-20 MED ORDER — TAMSULOSIN HCL 0.4 MG PO CAPS: 0.4000 mg | ORAL_CAPSULE | Freq: Every day | ORAL | 1 refills | Status: DC

## 2021-01-20 MED ORDER — CIPROFLOXACIN HCL 500 MG PO TABS
ORAL_TABLET | ORAL | Status: AC
  Filled 2021-01-20: qty 1

## 2021-01-20 MED ORDER — DIAZEPAM 5 MG PO TABS
10.0000 mg | ORAL_TABLET | ORAL | Status: AC
  Administered 2021-01-20: 10 mg via ORAL

## 2021-01-20 MED ORDER — SODIUM CHLORIDE 0.9 % IV SOLN: INTRAVENOUS | Status: DC

## 2021-01-20 NOTE — Op Note (Signed)
See [facility] OP note scanned into chart. Also because of the size, density, location and other factors that cannot be anticipated I feel this will likely be a staged procedure. This fact supersedes any indication in the scanned [facility] operative note to the contrary.  

## 2021-01-20 NOTE — H&P (Signed)
See scanned H&P

## 2021-01-22 ENCOUNTER — Encounter (HOSPITAL_BASED_OUTPATIENT_CLINIC_OR_DEPARTMENT_OTHER): Payer: Self-pay | Admitting: Urology

## 2021-02-10 DIAGNOSIS — N200 Calculus of kidney: Secondary | ICD-10-CM | POA: Diagnosis not present

## 2021-02-12 DIAGNOSIS — N200 Calculus of kidney: Secondary | ICD-10-CM | POA: Diagnosis not present

## 2021-02-25 DIAGNOSIS — N200 Calculus of kidney: Secondary | ICD-10-CM | POA: Diagnosis not present

## 2021-03-24 DIAGNOSIS — R7303 Prediabetes: Secondary | ICD-10-CM | POA: Diagnosis not present

## 2021-03-24 DIAGNOSIS — E7800 Pure hypercholesterolemia, unspecified: Secondary | ICD-10-CM | POA: Diagnosis not present

## 2021-03-24 DIAGNOSIS — Z23 Encounter for immunization: Secondary | ICD-10-CM | POA: Diagnosis not present

## 2021-03-24 DIAGNOSIS — Z0000 Encounter for general adult medical examination without abnormal findings: Secondary | ICD-10-CM | POA: Diagnosis not present

## 2021-03-24 DIAGNOSIS — F339 Major depressive disorder, recurrent, unspecified: Secondary | ICD-10-CM | POA: Diagnosis not present

## 2021-03-24 DIAGNOSIS — E039 Hypothyroidism, unspecified: Secondary | ICD-10-CM | POA: Diagnosis not present

## 2021-04-22 DIAGNOSIS — F411 Generalized anxiety disorder: Secondary | ICD-10-CM | POA: Diagnosis not present

## 2021-04-22 DIAGNOSIS — F331 Major depressive disorder, recurrent, moderate: Secondary | ICD-10-CM | POA: Diagnosis not present

## 2021-08-28 DIAGNOSIS — N200 Calculus of kidney: Secondary | ICD-10-CM | POA: Diagnosis not present

## 2021-10-15 DIAGNOSIS — F411 Generalized anxiety disorder: Secondary | ICD-10-CM | POA: Diagnosis not present

## 2021-10-15 DIAGNOSIS — F331 Major depressive disorder, recurrent, moderate: Secondary | ICD-10-CM | POA: Diagnosis not present

## 2022-04-14 ENCOUNTER — Telehealth: Payer: Self-pay | Admitting: Hematology and Oncology

## 2022-04-14 NOTE — Telephone Encounter (Signed)
Scheduled appt per 9/12 referral. Pt is aware of appt date and time. Pt is aware to arrive 15 mins prior to appt time and to bring and updated insurance card. Pt is aware of appt location.   

## 2022-04-16 ENCOUNTER — Encounter: Payer: Self-pay | Admitting: Cardiology

## 2022-04-16 ENCOUNTER — Ambulatory Visit: Payer: Medicare Other | Admitting: Cardiology

## 2022-04-16 VITALS — BP 137/71 | HR 56 | Temp 98.2°F | Resp 16 | Ht 67.0 in | Wt 132.0 lb

## 2022-04-16 DIAGNOSIS — I491 Atrial premature depolarization: Secondary | ICD-10-CM

## 2022-04-16 DIAGNOSIS — R9431 Abnormal electrocardiogram [ECG] [EKG]: Secondary | ICD-10-CM | POA: Insufficient documentation

## 2022-04-16 NOTE — Progress Notes (Signed)
Patient referred by Jarrett Soho, PA-C for [facility]  Subjective:   Tiffany Valentine, female    DOB: 09-Jan-1956, 66 y.o.   MRN: [id number]   Chief Complaint  Patient presents with   [facility]   New Patient (Initial Visit)    Referred by Jarrett Soho, PA     HPI  66 y.o. Caucasian female with PAC  Patient was incidentally found to have frequent PACs on EKG on routine wellness care.  She denies chest pain, shortness of breath, palpitations, leg edema, orthopnea, PND, TIA/syncope. Reviewed recent test results with the patient, details below.  On a separate note, patient's maternal cousin was recently found to have protein S deficiency, at the time of a fatal episode of sepsis and blood clots.  Patient is being tested for the same and is going to see a hematologist.  Patient drinks 1 cup of coffee, occasional social use of alcohol.  She does not have any history of asthma/COPD apnea.  Past Medical History:  Diagnosis Date   Depression    History of kidney stones    right renal calculus     Past Surgical History:  Procedure Laterality Date   CHOLECYSTECTOMY  12/2018   EXTRACORPOREAL SHOCK WAVE LITHOTRIPSY Left 01/20/2021   Procedure: EXTRACORPOREAL SHOCK WAVE LITHOTRIPSY (ESWL);  Surgeon: Crista Elliot, MD;  Location: [facility];  Service: Urology;  Laterality: Left;   IR URETERAL STENT RIGHT NEW ACCESS W/O SEP NEPHROSTOMY CATH  04/06/2019   NEPHROLITHOTOMY Right 04/06/2019   Procedure: NEPHROLITHOTOMY PERCUTANEOUS;  Surgeon: Marcine Matar, MD;  Location: [facility];  Service: Urology;  Laterality: Right;  2.5 HRS     Social History   Tobacco Use  Smoking Status Never  Smokeless Tobacco Never    Social History   Substance and Sexual Activity  Alcohol Use Yes   Comment: social     Family History  Problem Relation Age of Onset   Heart disease Father    Pancreatic cancer Father       Current Outpatient Medications:    acetaminophen  (TYLENOL) 500 MG tablet, Take 500 mg by mouth every 6 (six) hours as needed., Disp: , Rfl:    alendronate (FOSAMAX) 70 MG tablet, Take 70 mg by mouth every Friday., Disp: , Rfl:    Biotin 1000 MCG tablet, Take 1,000 mcg by mouth daily with lunch., Disp: , Rfl:    buPROPion (WELLBUTRIN XL) 150 MG 24 hr tablet, Take 150 mg by mouth daily with breakfast. , Disp: , Rfl:    Cholecalciferol (VITAMIN D-3) 125 MCG (5000 UT) TABS, Take 5,000 Units by mouth daily with lunch., Disp: , Rfl:    ibuprofen (ADVIL) 200 MG tablet, Take 400 mg by mouth every 8 (eight) hours as needed (headaches/ocular migraines.)., Disp: , Rfl:    levothyroxine (SYNTHROID) 75 MCG tablet, Take 75 mcg by mouth daily before breakfast., Disp: , Rfl:    Multiple Vitamin (MULTIVITAMIN WITH MINERALS) TABS tablet, Take 1 tablet by mouth daily with lunch. Women's Multivitamin 50+, Disp: , Rfl:    tamsulosin (FLOMAX) 0.4 MG CAPS capsule, Take 1 capsule (0.4 mg total) by mouth daily., Disp: 14 capsule, Rfl: 1   traMADol (ULTRAM) 50 MG tablet, Take by mouth every 6 (six) hours as needed., Disp: , Rfl:    Cardiovascular and other pertinent studies:  Reviewed external labs and tests, independently interpreted  EKG 04/16/2022: Sinus rhythm 86 bpm Frequent PACs   EKG 03/31/2022: Sinus rhythm 93 bpm  Frequent PAC   Recent labs: 03/31/2022: Glucose 100, BUN/Cr 13/0.8. EGFR 80. Na/K 141/5.2. Rest of the CMP normal H/H 13/41. MCV 90. Platelets 351 HbA1C NA Chol 183, TG 72, HDL 60, LDL 110 TSH 3.4 normal    Review of Systems  Cardiovascular:  Negative for chest pain, dyspnea on exertion, leg swelling, palpitations and syncope.         Vitals:   04/16/22 1320  BP: 137/71  Pulse: (!) 56  Resp: 16  Temp: 98.2 F (36.8 C)  SpO2: 97%     Body mass index is 20.67 kg/m. Filed Weights   04/16/22 1320  Weight: 132 lb (59.9 kg)     Objective:   Physical Exam Vitals and nursing note reviewed.  Constitutional:       General: She is not in acute distress. Neck:     Vascular: No JVD.  Cardiovascular:     Rate and Rhythm: Normal rate and regular rhythm.     Heart sounds: Normal heart sounds. No murmur heard. Pulmonary:     Effort: Pulmonary effort is normal.     Breath sounds: Normal breath sounds. No wheezing or rales.  Musculoskeletal:     Right lower leg: No edema.     Left lower leg: No edema.          Visit diagnoses:   ICD-10-CM   1. PAC (premature atrial contraction)  I49.1 EKG 12-Lead    PCV ECHOCARDIOGRAM COMPLETE    CT CARDIAC SCORING ([facility] LOCATIONS ONLY)    2. Abnormal EKG  R94.31 PCV ECHOCARDIOGRAM COMPLETE    CT CARDIAC SCORING ([facility] LOCATIONS ONLY)       Orders Placed This Encounter  Procedures   CT CARDIAC SCORING ([facility] LOCATIONS ONLY)   EKG 12-Lead   PCV ECHOCARDIOGRAM COMPLETE      Assessment & Recommendations:    66 y.o. Caucasian female with PAC  PAC: Incidental finding, asymptomatic.  Will obtain echocardiogram to rule out any structural cardiac abnormality.  Primary prevention: HDL 60, LDL 110.  We will check CT cardiac scoring for risk stratification.  Further recommendations after above testing.  Thank you for referring the patient to [place]. Please feel free to contact with any questions.   Nigel Mormon, MD Pager: [phone] Office: [phone]

## 2022-05-01 ENCOUNTER — Ambulatory Visit
Admission: RE | Admit: 2022-05-01 | Discharge: 2022-05-01 | Disposition: A | Discharge status: Home / Self Care | Payer: No Typology Code available for payment source | Source: Ambulatory Visit | Attending: Cardiology | Admitting: Cardiology

## 2022-05-01 DIAGNOSIS — R9431 Abnormal electrocardiogram [ECG] [EKG]: Secondary | ICD-10-CM

## 2022-05-01 DIAGNOSIS — I491 Atrial premature depolarization: Secondary | ICD-10-CM

## 2022-05-04 NOTE — Progress Notes (Signed)
[facility] [phone]   Fax:([phone] [place] NOTE  Patient Care Team: Marda Stalker, PA-C as PCP - General (Family Medicine)  Hematological/Oncological History # Family History of Protein S Deficiency 03/31/2022: Free Protein S 60 ( nml 61-136%), Total Protein S 72 (nm 60-150%), Protein S-Function 58 (nml 63-140%) 05/11/2022: establish care with Dr. Lorenso Courier   CHIEF COMPLAINTS/PURPOSE OF CONSULTATION:  "Family History of Protein S Deficiency "  HISTORY OF PRESENTING ILLNESS:  Tiffany Valentine 66 y.o. female with medical history significant for hypothyroidism, depression and kidney stones who presents for a family history of Protein S deficiency.   On review of the previous records Tiffany Valentine had a cousin who died suddenly from a pneumonia with sepsis and a nephew who recently developed a blood clot.  The nephew was found to have low levels of protein S.  The patient had protein S levels tested on 03/31/2022 which showed Free Protein S 60 ( nml 61-136%), Total Protein S 72 (nm 60-150%), Protein S-Function 58 (nml 63-140%).  Due to concern for these findings the patient was referred to hematology for further evaluation and management.  On exam today Tiffany Valentine reports that she is deeply saddened by the loss of her cousin.  Her cousin was like a sister and best friend to her.  She died of pneumonia with sepsis.  She reports that this is also concerning to her because she had a nephew who was in his early 1s and after a long flight from [place] came back and was sick in bed for several weeks.  He was found to have "3 major blood clots" and protein S level in the 40s.  He was put on blood thinner and told he would have to continue indefinitely.  The patient personally and never had any issues with a blood clot.  She had 3 pregnancies and 3 healthy babies with no history of miscarriages.  She is also had no strokes, TIAs, or heart attacks.  On  further discussion her mother had a TIA.  She reports that her father had atrial fibrillation and vascular mention and passed away of pancreatic cancer.  She notes that she is a never smoker but does drink wine socially on occasion.  She is previously an Optometrist.  She notes her energy levels are good and she has had no recent weight change.  She remains physically active and does practice yoga.  She otherwise denies any fevers, chills, sweats, nausea, vomiting or diarrhea.  A full 10 point ROS is listed below.  MEDICAL HISTORY:  Past Medical History:  Diagnosis Date   Depression    History of kidney stones    right renal calculus    SURGICAL HISTORY: Past Surgical History:  Procedure Laterality Date   CHOLECYSTECTOMY  12/2018   EXTRACORPOREAL SHOCK WAVE LITHOTRIPSY Left 01/20/2021   Procedure: EXTRACORPOREAL SHOCK WAVE LITHOTRIPSY (ESWL);  Surgeon: Lucas Mallow, MD;  Location: [facility];  Service: Urology;  Laterality: Left;   IR URETERAL STENT RIGHT NEW ACCESS W/O SEP NEPHROSTOMY CATH  04/06/2019   NEPHROLITHOTOMY Right 04/06/2019   Procedure: NEPHROLITHOTOMY PERCUTANEOUS;  Surgeon: Franchot Gallo, MD;  Location: [facility];  Service: Urology;  Laterality: Right;  2.5 HRS    SOCIAL HISTORY: Social History   Socioeconomic History   Marital status: Married    Spouse name: Not on file   Number of children: Not on file   Years of education: Not on file  Highest education level: Not on file  Occupational History   Not on file  Tobacco Use   Smoking status: Never   Smokeless tobacco: Never  Vaping Use   Vaping Use: Never used  Substance and Sexual Activity   Alcohol use: Yes    Comment: social   Drug use: Never   Sexual activity: Not on file  Other Topics Concern   Not on file  Social History Narrative   Not on file   Social Determinants of Health   Financial Resource Strain: Not on file  Food Insecurity: Not on file  Transportation Needs: Not on  file  Physical Activity: Not on file  Stress: Not on file  Social Connections: Not on file  Intimate Partner Violence: Not on file    FAMILY HISTORY: Family History  Problem Relation Age of Onset   Heart disease Father    Pancreatic cancer Father     ALLERGIES:  has No Known Allergies.  MEDICATIONS:  Current Outpatient Medications  Medication Sig Dispense Refill   Biotin 1000 MCG tablet Take 1,000 mcg by mouth daily with lunch.     buPROPion (WELLBUTRIN XL) 150 MG 24 hr tablet Take 150 mg by mouth daily with breakfast.      Cholecalciferol (VITAMIN D-3) 125 MCG (5000 UT) TABS Take 5,000 Units by mouth daily with lunch.     ibuprofen (ADVIL) 200 MG tablet Take 400 mg by mouth every 8 (eight) hours as needed (headaches/ocular migraines.).     ketorolac (ACULAR) 0.5 % ophthalmic solution Place 1 drop into the left eye 4 (four) times daily.     levothyroxine (SYNTHROID) 75 MCG tablet Take 75 mcg by mouth daily before breakfast.     Multiple Vitamin (MULTIVITAMIN WITH MINERALS) TABS tablet Take 1 tablet by mouth daily with lunch. Women's Multivitamin 50+     No current facility-administered medications for this visit.    REVIEW OF SYSTEMS:   Constitutional: ( - ) fevers, ( - )  chills , ( - ) night sweats Eyes: ( - ) blurriness of vision, ( - ) double vision, ( - ) watery eyes Ears, nose, mouth, throat, and face: ( - ) mucositis, ( - ) sore throat Respiratory: ( - ) cough, ( - ) dyspnea, ( - ) wheezes Cardiovascular: ( - ) palpitation, ( - ) chest discomfort, ( - ) lower extremity swelling Gastrointestinal:  ( - ) nausea, ( - ) heartburn, ( - ) change in bowel habits Skin: ( - ) abnormal skin rashes Lymphatics: ( - ) new lymphadenopathy, ( - ) easy bruising Neurological: ( - ) numbness, ( - ) tingling, ( - ) new weaknesses Behavioral/Psych: ( - ) mood change, ( - ) new changes  All other systems were reviewed with the patient and are negative.  PHYSICAL EXAMINATION:  Vitals:    05/11/22 0927  BP: (!) 131/59  Pulse: (!) 45  Resp: 17  Temp: (!) 97.2 F (36.2 C)  SpO2: 99%   Filed Weights   05/11/22 0927  Weight: 131 lb 7 oz (59.6 kg)    GENERAL: well appearing elderly Caucasian female in NAD  SKIN: skin color, texture, turgor are normal, no rashes or significant lesions EYES: conjunctiva are pink and non-injected, sclera clear LUNGS: clear to auscultation and percussion with normal breathing effort HEART: regular rate & rhythm and no murmurs and no lower extremity edema Musculoskeletal: no cyanosis of digits and no clubbing  PSYCH: alert & oriented x 3, fluent  speech NEURO: no focal motor/sensory deficits  LABORATORY DATA:  I have reviewed the data as listed    Latest Ref Rng & Units 04/07/2019    5:25 AM 04/03/2019    8:41 AM  CBC  WBC 4.0 - 10.5 K/uL  6.8   Hemoglobin 12.0 - 15.0 g/dL 12.8  14.0   Hematocrit 36.0 - 46.0 % 40.1  43.7   Platelets 150 - 400 K/uL  295        Latest Ref Rng & Units 04/03/2019    8:41 AM  CMP  Glucose 70 - 99 mg/dL 104   BUN 8 - 23 mg/dL 12   Creatinine 0.44 - 1.00 mg/dL 0.68   Sodium 135 - 145 mmol/L 140   Potassium 3.5 - 5.1 mmol/L 4.4   Chloride 98 - 111 mmol/L 106   CO2 22 - 32 mmol/L 26   Calcium 8.9 - 10.3 mg/dL 9.4     RADIOGRAPHIC STUDIES: CT CARDIAC SCORING ([facility] LOCATIONS ONLY)  Result Date: 05/04/2022 CLINICAL DATA:  66 year old Caucasian female with history of abnormal EKG. Evaluate for coronary artery disease. * Tracking Code: [place] * EXAM: CT CARDIAC CORONARY ARTERY CALCIUM SCORE TECHNIQUE: Non-contrast imaging through the heart was performed using prospective ECG gating. Image post processing was performed on an independent workstation, allowing for quantitative analysis of the heart and coronary arteries. Note that this exam targets the heart and the chest was not imaged in its entirety. COMPARISON:  No priors. FINDINGS: CORONARY CALCIUM SCORES: Left Main: 0 LAD: 22 LCx: 0 RCA: 0 Total Agatston  Score: 22 MESA database percentile: 59th AORTA MEASUREMENTS: Ascending Aorta: 3.5 cm Descending Aorta:2.4 cm OTHER FINDINGS: Within the visualized portions of the thorax there are no suspicious appearing pulmonary nodules or masses, there is no acute consolidative airspace disease, no pleural effusions, no pneumothorax and no lymphadenopathy. Visualized portions of the upper abdomen are unremarkable. There are no aggressive appearing lytic or blastic lesions noted in the visualized portions of the skeleton. IMPRESSION: 1. Patient's total coronary artery calcium score is 22 which is 59th percentile for patient's of matched age, gender and race/ethnicity. Please note that although the presence of coronary artery calcium documents the presence of coronary artery disease, the severity of this disease and any potential stenosis cannot be assessed on this noncontrast CT examination. Assessment for potential risk factor modification, dietary therapy or pharmacologic therapy may be warranted, if clinically indicated. 2. No significant incidental noncardiac findings are noted. Electronically Signed   By: Vinnie Langton M.D.   On: 05/04/2022 06:10    ASSESSMENT & PLAN Jonasia Coiner [place] 66 y.o. female with medical history significant for hypothyroidism, depression and kidney stones who presents for a family history of Protein S deficiency.   After review of the labs, review of the records, and discussion with the patient the patients findings are most consistent with mildly low protein S levels.  # Family History of Protein S Deficiency -- At this time very low clinical suspicion of the patient has a protein S deficiency given her lack of any blood clots in her history. -- The patient had modestly low levels of protein S, likely not of clinical significance. -- At this time the patient appears to have the same level of VTE risk as a person in the general population -- No need for repeat study since her protein S  levels are just collected in August 2023 -- Provided precautions for prolonged travel and VTE prophylaxis around time of surgery.  Recommended she consider compression stockings, baby aspirin for flight, and stretching every 2 hours during prolonged travel -- No need for return to our clinic and no need for labs today.  Plan to see patient back on an as-needed basis moving forward.  No orders of the defined types were placed in this encounter.   All questions were answered. The patient knows to call the clinic with any problems, questions or concerns.  A total of more than 45 minutes were spent on this encounter with face-to-face time and non-face-to-face time, including preparing to see the patient, ordering tests and/or medications, counseling the patient and coordination of care as outlined above.   Ledell Peoples, MD Department of Hematology/Oncology [employer] at [facility] Phone: [phone] Pager: [phone] Email: [email or web address]  05/12/2022 3:51 PM

## 2022-05-11 ENCOUNTER — Inpatient Hospital Stay: Payer: Medicare Other

## 2022-05-11 ENCOUNTER — Inpatient Hospital Stay: Payer: Medicare Other | Attending: Hematology and Oncology | Admitting: Hematology and Oncology

## 2022-05-11 VITALS — BP 131/59 | HR 45 | Temp 97.2°F | Resp 17 | Wt 131.4 lb

## 2022-05-11 DIAGNOSIS — Z800 Family history of malignant neoplasm of digestive organs: Secondary | ICD-10-CM | POA: Insufficient documentation

## 2022-05-11 DIAGNOSIS — Z1589 Genetic susceptibility to other disease: Secondary | ICD-10-CM | POA: Diagnosis present

## 2022-05-11 DIAGNOSIS — Z79899 Other long term (current) drug therapy: Secondary | ICD-10-CM | POA: Insufficient documentation

## 2022-05-11 DIAGNOSIS — E039 Hypothyroidism, unspecified: Secondary | ICD-10-CM | POA: Diagnosis not present

## 2022-05-11 DIAGNOSIS — Z832 Family history of diseases of the blood and blood-forming organs and certain disorders involving the immune mechanism: Secondary | ICD-10-CM | POA: Diagnosis present

## 2022-05-11 DIAGNOSIS — D689 Coagulation defect, unspecified: Secondary | ICD-10-CM | POA: Diagnosis not present

## 2022-05-11 DIAGNOSIS — F32A Depression, unspecified: Secondary | ICD-10-CM | POA: Insufficient documentation

## 2022-05-11 DIAGNOSIS — Z8249 Family history of ischemic heart disease and other diseases of the circulatory system: Secondary | ICD-10-CM | POA: Insufficient documentation

## 2022-05-12 ENCOUNTER — Other Ambulatory Visit: Payer: Self-pay | Admitting: Family Medicine

## 2022-05-14 ENCOUNTER — Other Ambulatory Visit: Payer: Self-pay | Admitting: Family Medicine

## 2022-05-14 DIAGNOSIS — Z1231 Encounter for screening mammogram for malignant neoplasm of breast: Secondary | ICD-10-CM

## 2022-05-15 ENCOUNTER — Ambulatory Visit: Payer: Medicare Other

## 2022-05-15 DIAGNOSIS — I491 Atrial premature depolarization: Secondary | ICD-10-CM

## 2022-05-15 DIAGNOSIS — R9431 Abnormal electrocardiogram [ECG] [EKG]: Secondary | ICD-10-CM

## 2022-05-18 ENCOUNTER — Other Ambulatory Visit: Payer: Self-pay | Admitting: Family Medicine

## 2022-05-18 DIAGNOSIS — M8589 Other specified disorders of bone density and structure, multiple sites: Secondary | ICD-10-CM

## 2022-05-18 DIAGNOSIS — M859 Disorder of bone density and structure, unspecified: Secondary | ICD-10-CM

## 2022-06-22 IMAGING — DX DG ABDOMEN 1V
2 series · 2 of 2 positions shown · non-contrast
Comparison: 11/22/2020

CLINICAL DATA: Nephrolithiasis.

EXAM:
ABDOMEN - 1 VIEW

[abdomen kub (1 of 2)]
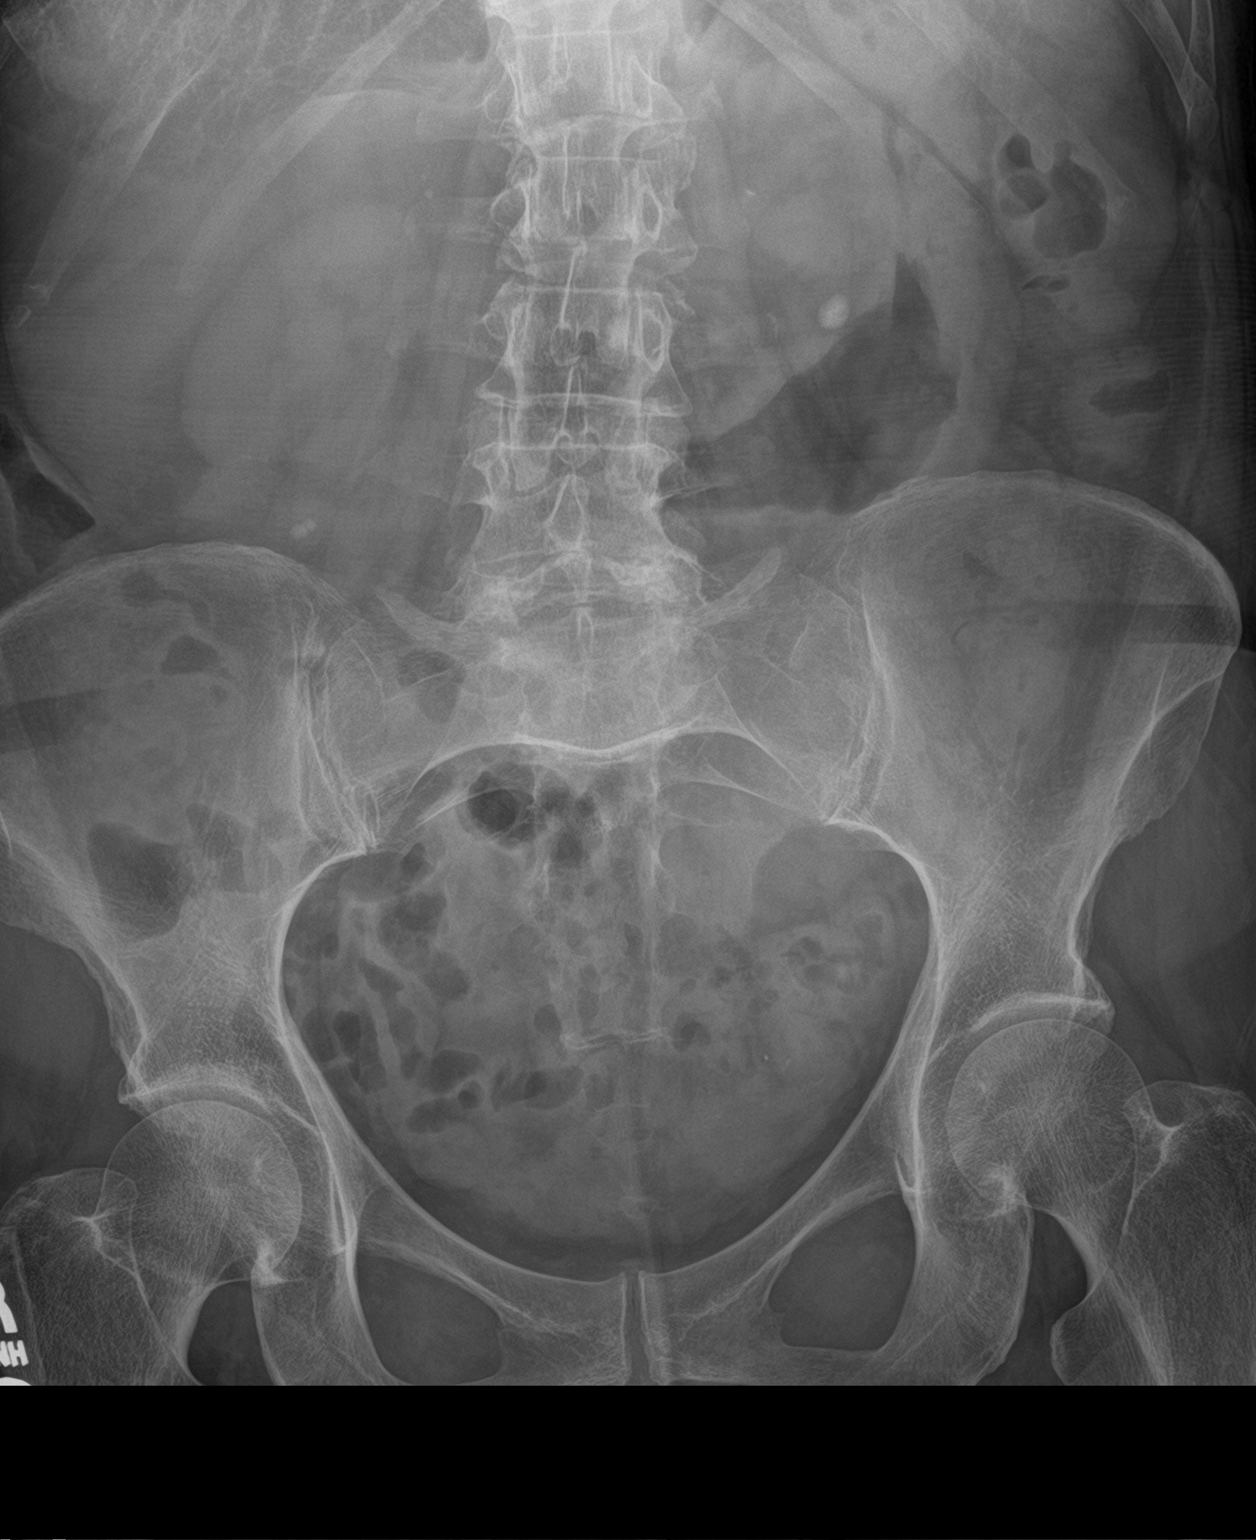

[abdomen kub (2 of 2)]
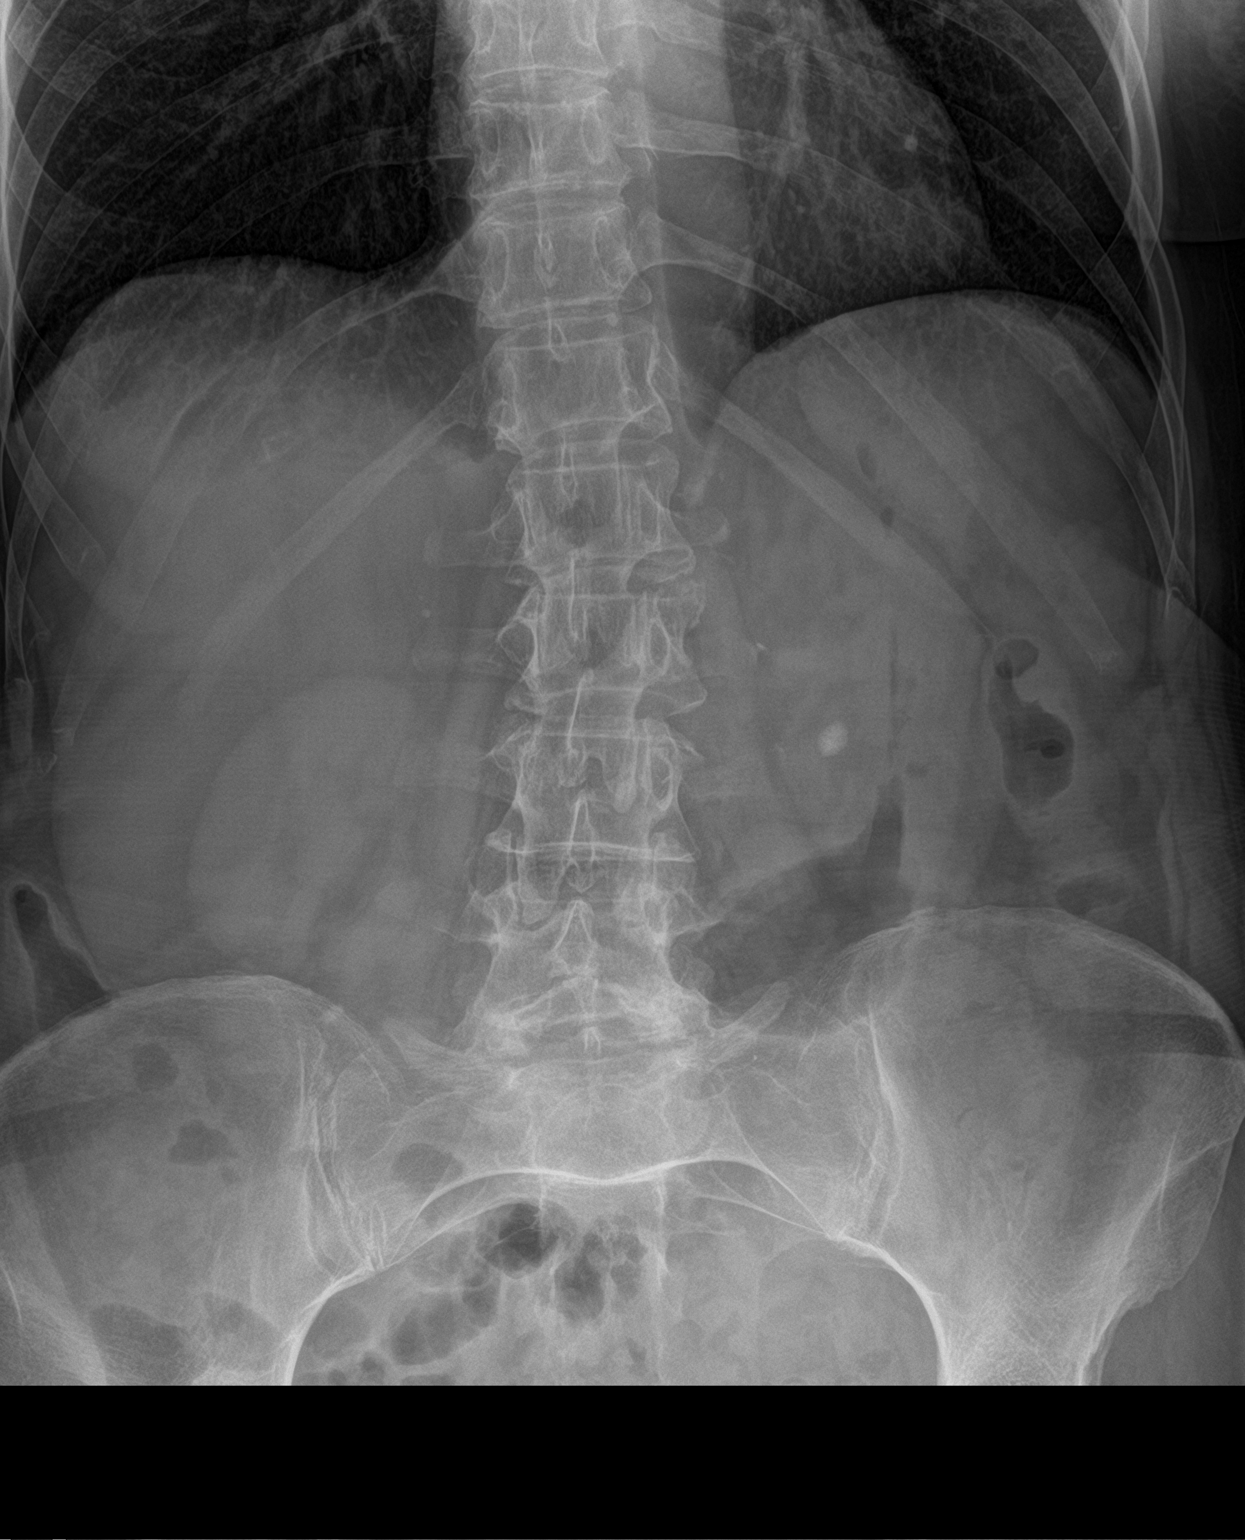

[2 of 2 positions shown; findings below may reference images not displayed]

FINDINGS: An 8 mm calculus is seen overlying the midpole of the left kidney. A
6 mm calculus is seen overlying the lower pole of the right kidney.
Moderate amount of stool seen in the lower abdomen and pelvis. No
evidence of dilated bowel loops.
IMPRESSION: Bilateral renal calculi, measuring 8 mm on the left and 6 mm on the
right.

## 2022-07-01 ENCOUNTER — Ambulatory Visit
Admission: RE | Admit: 2022-07-01 | Discharge: 2022-07-01 | Disposition: A | Discharge status: Home / Self Care | Payer: Medicare Other | Source: Ambulatory Visit | Attending: Family Medicine | Admitting: Family Medicine

## 2022-07-01 DIAGNOSIS — Z1231 Encounter for screening mammogram for malignant neoplasm of breast: Secondary | ICD-10-CM

## 2022-10-15 ENCOUNTER — Encounter: Payer: Self-pay | Admitting: Cardiology

## 2022-10-15 ENCOUNTER — Ambulatory Visit: Payer: Medicare Other | Admitting: Cardiology

## 2022-10-15 VITALS — BP 132/81 | HR 84 | Resp 16 | Ht 67.0 in | Wt 136.0 lb

## 2022-10-15 DIAGNOSIS — R931 Abnormal findings on diagnostic imaging of heart and coronary circulation: Secondary | ICD-10-CM

## 2022-10-15 DIAGNOSIS — I491 Atrial premature depolarization: Secondary | ICD-10-CM

## 2022-10-15 DIAGNOSIS — E782 Mixed hyperlipidemia: Secondary | ICD-10-CM

## 2022-10-15 MED ORDER — ROSUVASTATIN CALCIUM 10 MG PO TABS: 10.0000 mg | ORAL_TABLET | Freq: Every day | ORAL | 3 refills | Status: DC

## 2022-10-15 NOTE — Progress Notes (Signed)
Patient referred by Marda Stalker, PA-C for [facility]  Subjective:   Tiffany Valentine, female    DOB: 1956/03/11, 67 y.o.   MRN: [id number]   Chief Complaint  Patient presents with   Atrial Fibrillation   Follow-up    6 month     HPI  67 y.o. Caucasian female with PAC  Patient is doing well. Reviewed recent test results with the patient, details below.    Initial consultation visit 04/2022: Patient was incidentally found to have frequent PACs on EKG on routine wellness care.  She denies chest pain, shortness of breath, palpitations, leg edema, orthopnea, PND, TIA/syncope. Reviewed recent test results with the patient, details below.  On a separate note, patient's maternal cousin was recently found to have protein S deficiency, at the time of a fatal episode of sepsis and blood clots.  Patient is being tested for the same and is going to see a hematologist.  Patient drinks 1 cup of coffee, occasional social use of alcohol.  She does not have any history of asthma/COPD apnea.    Current Outpatient Medications:    Biotin 1000 MCG tablet, Take 1,000 mcg by mouth daily with lunch., Disp: , Rfl:    buPROPion (WELLBUTRIN XL) 150 MG 24 hr tablet, Take 150 mg by mouth daily with breakfast. , Disp: , Rfl:    Cholecalciferol (VITAMIN D-3) 125 MCG (5000 UT) TABS, Take 5,000 Units by mouth daily with lunch., Disp: , Rfl:    ibuprofen (ADVIL) 200 MG tablet, Take 400 mg by mouth every 8 (eight) hours as needed (headaches/ocular migraines.)., Disp: , Rfl:    ketorolac (ACULAR) 0.5 % ophthalmic solution, Place 1 drop into the left eye 4 (four) times daily., Disp: , Rfl:    levothyroxine (SYNTHROID) 75 MCG tablet, Take 75 mcg by mouth daily before breakfast., Disp: , Rfl:    Multiple Vitamin (MULTIVITAMIN WITH MINERALS) TABS tablet, Take 1 tablet by mouth daily with lunch. Women's Multivitamin 50+, Disp: , Rfl:    Cardiovascular and other pertinent studies:  Reviewed external labs and  tests, independently interpreted  EKG 10/15/2022: Sinus rhythm 84 bpm Normal EKG  EKG 04/16/2022: Sinus rhythm 86 bpm Frequent PACs   Echocardiogram 05/15/2022:  Left ventricle cavity is normal in size and wall thickness. Normal global  wall motion. Normal LV systolic function with EF 65%. Normal diastolic  filling pattern.  Mild (Grade I) mitral regurgitation.  Mild tricuspid regurgitation.  No evidence of pulmonary hypertension.   CT cardaic scoring 04/2022: LM: 0 LAD: 22 LCx: 0 RCA: 0   Total Agatston Score: 22 MESA database percentile: 59th   AORTA MEASUREMENTS:   Ascending Aorta: 3.5 cm Descending Aorta:2.4 cm   Recent labs: 03/31/2022: Glucose 100, BUN/Cr 13/0.8. EGFR 80. Na/K 141/5.2. Rest of the CMP normal H/H 13/41. MCV 90. Platelets 351 HbA1C NA Chol 183, TG 72, HDL 60, LDL 110 TSH 3.4 normal    Review of Systems  Cardiovascular:  Negative for chest pain, dyspnea on exertion, leg swelling, palpitations and syncope.         Vitals:   10/15/22 1119  BP: 132/81  Pulse: 84  Resp: 16  SpO2: 97%     Body mass index is 21.3 kg/m. Filed Weights   10/15/22 1119  Weight: 136 lb (61.7 kg)     Objective:   Physical Exam Vitals and nursing note reviewed.  Constitutional:      General: She is not in acute distress. Neck:  Vascular: No JVD.  Cardiovascular:     Rate and Rhythm: Normal rate and regular rhythm.     Heart sounds: Normal heart sounds. No murmur heard. Pulmonary:     Effort: Pulmonary effort is normal.     Breath sounds: Normal breath sounds. No wheezing or rales.  Musculoskeletal:     Right lower leg: No edema.     Left lower leg: No edema.          Visit diagnoses:   ICD-10-CM   1. PAC (premature atrial contraction)  I49.1 EKG 12-Lead    2. Elevated coronary artery calcium score  R93.1 rosuvastatin (CRESTOR) 10 MG tablet    3. Mixed hyperlipidemia  E78.2 rosuvastatin (CRESTOR) 10 MG tablet       Orders Placed  This Encounter  Procedures   EKG 12-Lead      Assessment & Recommendations:    67 y.o. Caucasian female with PAC  PAC: Incidental finding, asymptomatic.   Structurally normal heart (Echocardiogram 04/2022).  Primary prevention: HDL 60, LDL 110. CAC 22 (04/2022). Started Crestor 10 mg daily. She will get her annual labs with PCP in August 2024.  F/u w/me in August 2024.    Nigel Mormon, MD Pager: [phone] Office: [phone]

## 2022-10-19 ENCOUNTER — Other Ambulatory Visit: Payer: Self-pay | Admitting: Family Medicine

## 2022-10-19 DIAGNOSIS — M859 Disorder of bone density and structure, unspecified: Secondary | ICD-10-CM

## 2022-10-19 DIAGNOSIS — E2839 Other primary ovarian failure: Secondary | ICD-10-CM

## 2022-10-26 ENCOUNTER — Ambulatory Visit
Admission: RE | Admit: 2022-10-26 | Discharge: 2022-10-26 | Disposition: A | Discharge status: Home / Self Care | Payer: Medicare Other | Source: Ambulatory Visit | Attending: Family Medicine | Admitting: Family Medicine

## 2022-10-26 DIAGNOSIS — E2839 Other primary ovarian failure: Secondary | ICD-10-CM

## 2023-04-15 ENCOUNTER — Ambulatory Visit: Payer: Medicare Other | Admitting: Cardiology

## 2023-04-15 ENCOUNTER — Encounter: Payer: Self-pay | Admitting: Cardiology

## 2023-04-15 VITALS — BP 125/69 | HR 86 | Resp 16 | Ht 67.0 in | Wt 125.0 lb

## 2023-04-15 DIAGNOSIS — I491 Atrial premature depolarization: Secondary | ICD-10-CM

## 2023-04-15 DIAGNOSIS — E782 Mixed hyperlipidemia: Secondary | ICD-10-CM

## 2023-04-15 DIAGNOSIS — R931 Abnormal findings on diagnostic imaging of heart and coronary circulation: Secondary | ICD-10-CM

## 2023-04-15 MED ORDER — ROSUVASTATIN CALCIUM 10 MG PO TABS: 10.0000 mg | ORAL_TABLET | Freq: Every day | ORAL | 5 refills | Status: AC

## 2023-04-15 NOTE — Progress Notes (Signed)
Patient referred by Jarrett Soho, PA-C for [facility]  Subjective:   Tiffany Valentine, female    DOB: [birth year], 67 y.o.   MRN: [id number]   Chief Complaint  Patient presents with   PAC (premature atrial contraction)     HPI  67 y.o. Caucasian female with PAC  Patient is doing well. Reviewed recent test results with the patient, details below.    Initial consultation visit 04/2022: Patient was incidentally found to have frequent PACs on EKG on routine wellness care.  She denies chest pain, shortness of breath, palpitations, leg edema, orthopnea, PND, TIA/syncope. Reviewed recent test results with the patient, details below.  On a separate note, patient's maternal cousin was recently found to have protein S deficiency, at the time of a fatal episode of sepsis and blood clots.  Patient is being tested for the same and is going to see a hematologist.  Patient drinks 1 cup of coffee, occasional social use of alcohol.  She does not have any history of asthma/COPD apnea.    Current Outpatient Medications:    Biotin 1000 MCG tablet, Take 1,000 mcg by mouth daily with lunch., Disp: , Rfl:    buPROPion (WELLBUTRIN XL) 150 MG 24 hr tablet, Take 150 mg by mouth daily with breakfast. , Disp: , Rfl:    Cholecalciferol (VITAMIN D-3) 125 MCG (5000 UT) TABS, Take 5,000 Units by mouth daily with lunch., Disp: , Rfl:    ibuprofen (ADVIL) 200 MG tablet, Take 400 mg by mouth every 8 (eight) hours as needed (headaches/ocular migraines.)., Disp: , Rfl:    levothyroxine (SYNTHROID) 75 MCG tablet, Take 75 mcg by mouth daily before breakfast., Disp: , Rfl:    Multiple Vitamin (MULTIVITAMIN WITH MINERALS) TABS tablet, Take 1 tablet by mouth daily with lunch. Women's Multivitamin 50+, Disp: , Rfl:    rosuvastatin (CRESTOR) 10 MG tablet, Take 1 tablet (10 mg total) by mouth daily., Disp: 90 tablet, Rfl: 3   Cardiovascular and other pertinent studies:  Reviewed external labs and tests, independently  interpreted  EKG 04/15/2023: Sinus rhythm 91 bpm Frequent PACS/triplets, not noted on EKG in 10/2022  Echocardiogram 05/15/2022:  Left ventricle cavity is normal in size and wall thickness. Normal global  wall motion. Normal LV systolic function with EF 65%. Normal diastolic  filling pattern.  Mild (Grade I) mitral regurgitation.  Mild tricuspid regurgitation.  No evidence of pulmonary hypertension.   CT cardaic scoring 04/2022: LM: 0 LAD: 22 LCx: 0 RCA: 0   Total Agatston Score: 22 MESA database percentile: 59th   AORTA MEASUREMENTS:   Ascending Aorta: 3.5 cm Descending Aorta:2.4 cm   Recent labs: 04/14/2023: Glucose 105, BUN/Cr 15/0.77. EGFR 83. Na/K 138/4.7. Rest of the CMP normal HbA1C 5.9% Chol 106, TG 91, HDL 44, LDL 45 TSH 1.3 normal  03/31/2022: Glucose 100, BUN/Cr 13/0.8. EGFR 80. Na/K 141/5.2. Rest of the CMP normal H/H 13/41. MCV 90. Platelets 351 HbA1C NA Chol 183, TG 72, HDL 60, LDL 110 TSH 3.4 normal    Review of Systems  Cardiovascular:  Negative for chest pain, dyspnea on exertion, leg swelling, palpitations and syncope.         Vitals:   04/15/23 1124  SpO2: 97%     Body mass index is 19.58 kg/m. Filed Weights   04/15/23 1124  Weight: 125 lb (56.7 kg)     Objective:   Physical Exam Vitals and nursing note reviewed.  Constitutional:      General: She is not  in acute distress. Neck:     Vascular: No JVD.  Cardiovascular:     Rate and Rhythm: Normal rate and regular rhythm.     Heart sounds: Normal heart sounds. No murmur heard. Pulmonary:     Effort: Pulmonary effort is normal.     Breath sounds: Normal breath sounds. No wheezing or rales.  Musculoskeletal:     Right lower leg: No edema.     Left lower leg: No edema.          Visit diagnoses:   ICD-10-CM   1. PAC (premature atrial contraction)  I49.1 EKG 12-Lead    2. Elevated coronary artery calcium score  R93.1 rosuvastatin (CRESTOR) 10 MG tablet    3. Mixed  hyperlipidemia  E78.2 rosuvastatin (CRESTOR) 10 MG tablet       Orders Placed This Encounter  Procedures   EKG 12-Lead      Assessment & Recommendations:    67 y.o. Caucasian female with PAC  PAC: Incidental finding, asymptomatic.   Structurally normal heart (Echocardiogram 04/2022).  Primary prevention: CAC 22 (04/2022). LDL down from 110 to 45 on Crestor 10 mg daily. Refilled Crestor 10 mg daily. This can be refilled by PCP in future.  I will see her as needed.    Elder Negus, MD Pager: [phone] Office: [phone]

## 2023-07-14 ENCOUNTER — Other Ambulatory Visit (HOSPITAL_COMMUNITY): Payer: Self-pay | Admitting: Adult Health

## 2023-07-14 DIAGNOSIS — N1330 Unspecified hydronephrosis: Secondary | ICD-10-CM

## 2023-07-21 ENCOUNTER — Encounter (HOSPITAL_COMMUNITY)
Admission: RE | Admit: 2023-07-21 | Discharge: 2023-07-21 | Disposition: A | Discharge status: Home / Self Care | Payer: Medicare Other | Source: Ambulatory Visit | Attending: Adult Health | Admitting: Adult Health

## 2023-07-21 DIAGNOSIS — N1330 Unspecified hydronephrosis: Secondary | ICD-10-CM | POA: Insufficient documentation

## 2023-07-21 MED ORDER — TECHNETIUM TC 99M MERTIATIDE
5.5000 | Freq: Once | INTRAVENOUS | Status: AC
  Administered 2023-07-21: 5.5 via INTRAVENOUS

## 2023-07-21 MED ORDER — FUROSEMIDE 10 MG/ML IJ SOLN
28.0000 mg | Freq: Once | INTRAMUSCULAR | Status: AC
  Administered 2023-07-21: 28 mg via INTRAVENOUS

## 2023-07-21 MED ORDER — FUROSEMIDE 10 MG/ML IJ SOLN
INTRAMUSCULAR | Status: AC
  Filled 2023-07-21: qty 4

## 2023-10-29 ENCOUNTER — Other Ambulatory Visit: Payer: Self-pay | Admitting: Family Medicine

## 2023-10-29 DIAGNOSIS — Z1231 Encounter for screening mammogram for malignant neoplasm of breast: Secondary | ICD-10-CM

## 2023-11-17 ENCOUNTER — Ambulatory Visit
Admission: RE | Admit: 2023-11-17 | Discharge: 2023-11-17 | Disposition: A | Discharge status: Home / Self Care | Source: Ambulatory Visit | Attending: Family Medicine | Admitting: Family Medicine

## 2023-11-17 DIAGNOSIS — Z1231 Encounter for screening mammogram for malignant neoplasm of breast: Secondary | ICD-10-CM
